# Patient Record
Sex: Male | Born: 1990 | Race: White | Hispanic: No | Marital: Single | Smoking: Current every day smoker
Health system: Southern US, Community
[De-identification: ages and names within clinical notes are randomized; demographics above are authoritative.]

## PROBLEM LIST (undated history)

## (undated) DIAGNOSIS — I253 Aneurysm of heart: Secondary | ICD-10-CM

## (undated) DIAGNOSIS — F1990 Other psychoactive substance use, unspecified, uncomplicated: Secondary | ICD-10-CM

## (undated) DIAGNOSIS — J939 Pneumothorax, unspecified: Secondary | ICD-10-CM

## (undated) DIAGNOSIS — K759 Inflammatory liver disease, unspecified: Secondary | ICD-10-CM

## (undated) DIAGNOSIS — K219 Gastro-esophageal reflux disease without esophagitis: Secondary | ICD-10-CM

## (undated) DIAGNOSIS — G43909 Migraine, unspecified, not intractable, without status migrainosus: Secondary | ICD-10-CM

## (undated) DIAGNOSIS — F1910 Other psychoactive substance abuse, uncomplicated: Secondary | ICD-10-CM

## (undated) DIAGNOSIS — R011 Cardiac murmur, unspecified: Secondary | ICD-10-CM

## (undated) HISTORY — PX: CHEST TUBE INSERTION: SHX231

## (undated) HISTORY — PX: WRIST SURGERY: SHX841

## (undated) HISTORY — DX: Migraine, unspecified, not intractable, without status migrainosus: G43.909

## (undated) HISTORY — DX: Gastro-esophageal reflux disease without esophagitis: K21.9

## (undated) HISTORY — DX: Inflammatory liver disease, unspecified: K75.9

## (undated) HISTORY — PX: CARDIAC SURGERY: SHX584

---

## 2008-11-15 ENCOUNTER — Emergency Department (HOSPITAL_COMMUNITY): Admission: EM | Admit: 2008-11-15 | Discharge: 2008-11-15 | Payer: Self-pay | Admitting: Emergency Medicine
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
## 2010-03-16 ENCOUNTER — Emergency Department (HOSPITAL_COMMUNITY)
Admission: EM | Admit: 2010-03-16 | Discharge: 2010-03-16 | Payer: Self-pay | Source: Home / Self Care | Admitting: Emergency Medicine

## 2010-08-25 ENCOUNTER — Emergency Department (HOSPITAL_COMMUNITY)
Admission: EM | Admit: 2010-08-25 | Discharge: 2010-08-25 | Payer: Self-pay | Source: Home / Self Care | Admitting: Emergency Medicine

## 2010-11-14 LAB — RAPID URINE DRUG SCREEN, HOSP PERFORMED
Barbiturates: NOT DETECTED
Benzodiazepines: POSITIVE — AB
Opiates: POSITIVE — AB

## 2010-11-14 LAB — ETHANOL: Alcohol, Ethyl (B): <5 mg/dL (ref 0–10)

## 2010-11-20 LAB — POCT I-STAT, CHEM 8
Calcium, Ion: 1.12 mmol/L (ref 1.12–1.32)
Chloride: 106 mEq/L (ref 96–112)
Glucose, Bld: 97 mg/dL (ref 70–99)
Hemoglobin: 15 g/dL (ref 13.0–17.0)
Sodium: 140 mEq/L (ref 135–145)

## 2010-11-20 LAB — URINALYSIS, ROUTINE W REFLEX MICROSCOPIC
Ketones, ur: NEGATIVE mg/dL
Nitrite: NEGATIVE
Urobilinogen, UA: 0.2 mg/dL (ref 0.0–1.0)
pH: 7.5 (ref 5.0–8.0)

## 2010-11-20 LAB — SAMPLE TO BLOOD BANK

## 2010-12-15 LAB — RAPID URINE DRUG SCREEN, HOSP PERFORMED
Amphetamines: NOT DETECTED
Benzodiazepines: POSITIVE — AB
Opiates: NOT DETECTED
Tetrahydrocannabinol: POSITIVE — AB

## 2010-12-15 LAB — CBC
HCT: 50.1 % (ref 39.0–52.0)
MCHC: 35 g/dL (ref 30.0–36.0)
Platelets: 230 10*3/uL (ref 150–400)
RBC: 5.24 MIL/uL (ref 4.22–5.81)
WBC: 15.2 10*3/uL — ABNORMAL HIGH (ref 4.0–10.5)

## 2010-12-15 LAB — COMPREHENSIVE METABOLIC PANEL
Albumin: 4.6 g/dL (ref 3.5–5.2)
BUN: 8 mg/dL (ref 6–23)
CO2: 21 mEq/L (ref 19–32)
Calcium: 9.1 mg/dL (ref 8.4–10.5)
GFR calc non Af Amer: >60 mL/min (ref >60)
Potassium: 3.8 mEq/L (ref 3.5–5.1)
Sodium: 142 mEq/L (ref 135–145)

## 2010-12-15 LAB — URINALYSIS, ROUTINE W REFLEX MICROSCOPIC
Leukocytes, UA: NEGATIVE
Specific Gravity, Urine: 1.005 (ref 1.005–1.030)
pH: 6 (ref 5.0–8.0)

## 2010-12-15 LAB — DIFFERENTIAL
Basophils Absolute: 0.1 10*3/uL (ref 0.0–0.1)
Basophils Relative: 1 % (ref 0–1)
Eosinophils Absolute: 0.2 10*3/uL (ref 0.0–0.7)
Lymphocytes Relative: 27 % (ref 12–46)
Monocytes Absolute: 0.8 10*3/uL (ref 0.1–1.0)
Monocytes Relative: 5 % (ref 3–12)
Neutro Abs: 10 10*3/uL — ABNORMAL HIGH (ref 1.7–7.7)

## 2010-12-15 LAB — URINE MICROSCOPIC-ADD ON
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
## 2012-07-01 ENCOUNTER — Encounter (HOSPITAL_COMMUNITY): Payer: Self-pay

## 2012-07-01 ENCOUNTER — Emergency Department (HOSPITAL_COMMUNITY)
Admission: EM | Admit: 2012-07-01 | Discharge: 2012-07-01 | Disposition: A | Discharge status: Home / Self Care | Payer: No Typology Code available for payment source | Attending: Emergency Medicine | Admitting: Emergency Medicine

## 2012-07-01 ENCOUNTER — Emergency Department (HOSPITAL_COMMUNITY): Payer: No Typology Code available for payment source

## 2012-07-01 DIAGNOSIS — R071 Chest pain on breathing: Secondary | ICD-10-CM | POA: Insufficient documentation

## 2012-07-01 DIAGNOSIS — R011 Cardiac murmur, unspecified: Secondary | ICD-10-CM | POA: Insufficient documentation

## 2012-07-01 DIAGNOSIS — R0789 Other chest pain: Secondary | ICD-10-CM

## 2012-07-01 DIAGNOSIS — J40 Bronchitis, not specified as acute or chronic: Secondary | ICD-10-CM | POA: Insufficient documentation

## 2012-07-01 DIAGNOSIS — F17200 Nicotine dependence, unspecified, uncomplicated: Secondary | ICD-10-CM | POA: Insufficient documentation

## 2012-07-01 HISTORY — DX: Cardiac murmur, unspecified: R01.1

## 2012-07-01 MED ORDER — ALBUTEROL SULFATE (5 MG/ML) 0.5% IN NEBU
2.5000 mg | INHALATION_SOLUTION | Freq: Once | RESPIRATORY_TRACT | Status: AC
  Administered 2012-07-01: 2.5 mg via RESPIRATORY_TRACT
  Filled 2012-07-01: qty 0.5

## 2012-07-01 MED ORDER — ACETAMINOPHEN 325 MG PO TABS
650.0000 mg | ORAL_TABLET | Freq: Once | ORAL | Status: AC
  Administered 2012-07-01: 650 mg via ORAL
  Filled 2012-07-01: qty 2

## 2012-07-01 MED ORDER — ALBUTEROL SULFATE HFA 108 (90 BASE) MCG/ACT IN AERS
2.0000 | INHALATION_SPRAY | RESPIRATORY_TRACT | Status: DC | PRN
  Administered 2012-07-01: 2 via RESPIRATORY_TRACT
  Filled 2012-07-01: qty 6.7

## 2012-07-01 MED ORDER — IPRATROPIUM BROMIDE 0.02 % IN SOLN
0.5000 mg | Freq: Once | RESPIRATORY_TRACT | Status: AC
  Administered 2012-07-01: 0.5 mg via RESPIRATORY_TRACT
  Filled 2012-07-01: qty 2.5

## 2012-07-01 MED ORDER — HYDROCODONE-HOMATROPINE 5-1.5 MG PO TABS: 1.0000 | ORAL_TABLET | Freq: Four times a day (QID) | ORAL | Status: DC | PRN

## 2012-07-01 NOTE — ED Notes (Signed)
Respiratory to give instructions on inhaler, patient has never used inhaler before

## 2012-07-01 NOTE — ED Notes (Signed)
Family at bedside. Patient was wondering how long till he got his breathing treatment. Patient was informed that Respiratory had been called and would be in shortly. RN made aware. Patient does not need anything else at this time.

## 2012-07-01 NOTE — ED Notes (Signed)
Pt c/o productive cough with "thick yellow" sputum x 3 days.  C/o soreness in ribs and chest, worse with coughing.  Reports generalized weakness, vomiting, and cold sweats at night.

## 2012-07-01 NOTE — ED Provider Notes (Signed)
History     CSN: [id number]  Arrival date & time 07/01/12  1423   First MD Initiated Contact with Patient 07/01/12 1533      Chief Complaint  Patient presents with  . Cough    (Consider location/radiation/quality/duration/timing/severity/associated sxs/prior treatment) Patient is a 21 y.o. male presenting with cough. The history is provided by the patient.  Cough  He has been complaining of a cough and sore throat for the last 3 days. He has had subjective fever as well as chills and sweats. Cough is productive of yellowish to greenish sputum. There is mild dyspnea. He has not taken any medication. He he is complaining of soreness in his chest with coughing. There's been no nausea or vomiting no arthralgias or myalgias. He denies any sick contacts. Where he works, he is exposed to a lot of dust and he does smoke one pack of cigarettes a day.  Past Medical History  Diagnosis Date  . Heart murmur     Past Surgical History  Procedure Date  . Wrist surgery     No family history on file.  History  Substance Use Topics  . Smoking status: Current Every Day Smoker  . Smokeless tobacco: Not on file  . Alcohol Use: Yes     occ      Review of Systems  Respiratory: Positive for cough.   All other systems reviewed and are negative.    Allergies  Neosporin and Sulfa antibiotics  Home Medications  No current outpatient prescriptions on file.  BP 121/72  Pulse 91  Temp 98.2 F (36.8 C) (Oral)  Resp 20  Ht 6' (1.829 m)  Wt 165 lb (74.844 kg)  BMI 22.38 kg/m2  SpO2 98%  Physical Exam  Nursing note and vitals reviewed. 21 year old male, resting comfortably and in no acute distress. Vital signs are normal. Oxygen saturation is 98%, which is normal. Head is normocephalic and atraumatic. PERRLA, EOMI. Oropharynx is clear. Neck is nontender and supple without adenopathy or JVD. Back is nontender and there is no CVA tenderness. Lungs have diffuse expiratory wheezes  without rales or rhonchi. Chest is nontender. Heart has regular rate and rhythm without murmur. Abdomen is soft, flat, nontender without masses or hepatosplenomegaly and peristalsis is normoactive. Extremities have no cyanosis or edema, full range of motion is present. Skin is warm and dry without rash. Neurologic: Mental status is normal, cranial nerves are intact, there are no motor or sensory deficits.   ED Course  Procedures (including critical care time)  Dg Chest 2 View  07/01/2012  *RADIOLOGY REPORT*  Clinical Data: Cough with chills and fever.  CHEST - 2 VIEW  Comparison: CXR 11/15/2008.  Findings:  The heart size and mediastinal contours are within normal limits.  Both lungs are clear.  The visualized skeletal structures are unremarkable.  IMPRESSION: No active cardiopulmonary disease.   Original Report Authenticated By: Elsie Stain, M.D.      1. Bronchitis   2. Chest wall pain       MDM  Respiratory tract infection with bronchospasm. This is most likely viral. Chest x-ray does not show any evidence of pneumonia. He is concerned about influenza, but he is outside of the timeframe during which antiviral medication would be effective. He'll be given a therapeutic trial of albuterol with Atrovent.  After albuterol with Atrovent, he is breathing better and on reexam, lungs are clear. He states he is still coughing and still having some soreness in his ribs.  He is advised that the soreness in her ribs would continue until he stopped coughing. There is no indication for antibiotics at this point. He is sent home with prescription for Hycodan tablets and he is given an albuterol inhaler.     Dione Booze, MD 07/01/12 1710

## 2013-01-20 ENCOUNTER — Emergency Department (HOSPITAL_COMMUNITY)
Admission: EM | Admit: 2013-01-20 | Discharge: 2013-01-20 | Disposition: A | Discharge status: Home / Self Care | Payer: Self-pay | Attending: Emergency Medicine | Admitting: Emergency Medicine

## 2013-01-20 ENCOUNTER — Emergency Department (HOSPITAL_COMMUNITY): Payer: Self-pay

## 2013-01-20 ENCOUNTER — Encounter (HOSPITAL_COMMUNITY): Payer: Self-pay

## 2013-01-20 DIAGNOSIS — IMO0002 Reserved for concepts with insufficient information to code with codable children: Secondary | ICD-10-CM | POA: Insufficient documentation

## 2013-01-20 DIAGNOSIS — S1093XA Contusion of unspecified part of neck, initial encounter: Secondary | ICD-10-CM | POA: Insufficient documentation

## 2013-01-20 DIAGNOSIS — S0003XA Contusion of scalp, initial encounter: Secondary | ICD-10-CM | POA: Insufficient documentation

## 2013-01-20 DIAGNOSIS — Z79899 Other long term (current) drug therapy: Secondary | ICD-10-CM | POA: Insufficient documentation

## 2013-01-20 DIAGNOSIS — R011 Cardiac murmur, unspecified: Secondary | ICD-10-CM | POA: Insufficient documentation

## 2013-01-20 DIAGNOSIS — F17200 Nicotine dependence, unspecified, uncomplicated: Secondary | ICD-10-CM | POA: Insufficient documentation

## 2013-01-20 LAB — ACETAMINOPHEN LEVEL: Acetaminophen (Tylenol), Serum: <15 ug/mL (ref 10–30)

## 2013-01-20 LAB — CBC WITH DIFFERENTIAL/PLATELET
Band Neutrophils: 0 % (ref 0–10)
Basophils Absolute: 0 10*3/uL (ref 0.0–0.1)
Basophils Relative: 0 % (ref 0–1)
Eosinophils Absolute: 0 10*3/uL (ref 0.0–0.7)
Eosinophils Relative: 0 % (ref 0–5)
HCT: 45.5 % (ref 39.0–52.0)
Hemoglobin: 16 g/dL (ref 13.0–17.0)
MCH: 32.9 pg (ref 26.0–34.0)
MCHC: 35.2 g/dL (ref 30.0–36.0)
MCV: 93.4 fL (ref 78.0–100.0)
Metamyelocytes Relative: 0 %
Myelocytes: 0 %
Neutro Abs: 3.7 10*3/uL (ref 1.7–7.7)
Neutrophils Relative %: 50 % (ref 43–77)
Promyelocytes Absolute: 0 %
RBC: 4.87 MIL/uL (ref 4.22–5.81)

## 2013-01-20 LAB — URINALYSIS, ROUTINE W REFLEX MICROSCOPIC
Hgb urine dipstick: NEGATIVE
Leukocytes, UA: NEGATIVE
Protein, ur: NEGATIVE mg/dL
Specific Gravity, Urine: >1.03 — ABNORMAL HIGH (ref 1.005–1.030)
Urobilinogen, UA: 0.2 mg/dL (ref 0.0–1.0)

## 2013-01-20 LAB — RAPID URINE DRUG SCREEN, HOSP PERFORMED
Cocaine: NOT DETECTED
Opiates: NOT DETECTED
Tetrahydrocannabinol: POSITIVE — AB

## 2013-01-20 LAB — BASIC METABOLIC PANEL
CO2: 23 mEq/L (ref 19–32)
Chloride: 105 mEq/L (ref 96–112)
Creatinine, Ser: 0.85 mg/dL (ref 0.50–1.35)
GFR calc Af Amer: >90 mL/min (ref >90)
Potassium: 3.9 mEq/L (ref 3.5–5.1)
Sodium: 142 mEq/L (ref 135–145)

## 2013-01-20 LAB — ETHANOL: Alcohol, Ethyl (B): 48 mg/dL — ABNORMAL HIGH (ref 0–11)
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
## 2013-01-20 NOTE — ED Notes (Signed)
Pt clothing bagged, labeled & placed in storage closet.
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
## 2013-01-20 NOTE — ED Notes (Signed)
Pt given his ER paperwork & pt left department w/ [employer].

## 2014-02-13 ENCOUNTER — Encounter (HOSPITAL_COMMUNITY): Payer: Self-pay | Admitting: Emergency Medicine

## 2014-02-13 ENCOUNTER — Emergency Department (HOSPITAL_COMMUNITY)
Admission: EM | Admit: 2014-02-13 | Discharge: 2014-02-13 | Disposition: A | Discharge status: Home / Self Care | Payer: Self-pay | Attending: Emergency Medicine | Admitting: Emergency Medicine

## 2014-02-13 DIAGNOSIS — L0390 Cellulitis, unspecified: Secondary | ICD-10-CM

## 2014-02-13 DIAGNOSIS — Z792 Long term (current) use of antibiotics: Secondary | ICD-10-CM | POA: Insufficient documentation

## 2014-02-13 DIAGNOSIS — I808 Phlebitis and thrombophlebitis of other sites: Secondary | ICD-10-CM | POA: Insufficient documentation

## 2014-02-13 DIAGNOSIS — R011 Cardiac murmur, unspecified: Secondary | ICD-10-CM | POA: Insufficient documentation

## 2014-02-13 DIAGNOSIS — I809 Phlebitis and thrombophlebitis of unspecified site: Secondary | ICD-10-CM

## 2014-02-13 DIAGNOSIS — F17200 Nicotine dependence, unspecified, uncomplicated: Secondary | ICD-10-CM | POA: Insufficient documentation

## 2014-02-13 DIAGNOSIS — IMO0002 Reserved for concepts with insufficient information to code with codable children: Secondary | ICD-10-CM | POA: Insufficient documentation

## 2014-02-13 MED ORDER — CEPHALEXIN 500 MG PO CAPS: 500.0000 mg | ORAL_CAPSULE | Freq: Four times a day (QID) | ORAL | Status: DC

## 2014-02-13 MED ORDER — IBUPROFEN 600 MG PO TABS: 600.0000 mg | ORAL_TABLET | Freq: Four times a day (QID) | ORAL | Status: DC | PRN

## 2014-02-13 MED ORDER — HYDROCODONE-ACETAMINOPHEN 5-325 MG PO TABS: 1.0000 | ORAL_TABLET | Freq: Four times a day (QID) | ORAL | Status: DC | PRN

## 2014-02-13 NOTE — Discharge Instructions (Signed)
Cellulitis  Cellulitis is an infection of the skin and the tissue beneath it. The infected area is usually red and tender. Cellulitis occurs most often in the arms and lower legs.   CAUSES   Cellulitis is caused by bacteria that enter the skin through cracks or cuts in the skin. The most common types of bacteria that cause cellulitis are Staphylococcus and Streptococcus.  SYMPTOMS    Redness and warmth.   Swelling.   Tenderness or pain.   Fever.  DIAGNOSIS   Your caregiver can usually determine what is wrong based on a physical exam. Blood tests may also be done.  TREATMENT   Treatment usually involves taking an antibiotic medicine.  HOME CARE INSTRUCTIONS    Take your antibiotics as directed. Finish them even if you start to feel better.   Keep the infected arm or leg elevated to reduce swelling.   Apply a warm cloth to the affected area up to 4 times per day to relieve pain.   Only take over-the-counter or prescription medicines for pain, discomfort, or fever as directed by your caregiver.   Keep all follow-up appointments as directed by your caregiver.  SEEK MEDICAL CARE IF:    You notice red streaks coming from the infected area.   Your red area gets larger or turns dark in color.   Your bone or joint underneath the infected area becomes painful after the skin has healed.   Your infection returns in the same area or another area.   You notice a swollen bump in the infected area.   You develop new symptoms.  SEEK IMMEDIATE MEDICAL CARE IF:    You have a fever.   You feel very sleepy.   You develop vomiting or diarrhea.   You have a general ill feeling (malaise) with muscle aches and pains.  MAKE SURE YOU:    Understand these instructions.   Will watch your condition.   Will get help right away if you are not doing well or get worse.  Document Released: 05/31/2005 Document Revised: 02/20/2012 Document Reviewed: 11/06/2011  Exit[employer] Patient Information 2014 [employer].  Phlebitis  Phlebitis is soreness and swelling (inflammation) of a vein. This can occur in your arms, legs, or torso (trunk), as well as deeper inside your body. Phlebitis is usually not serious when it occurs close to the surface of the body. However, it can cause serious problems when it occurs in a vein deeper inside the body.  CAUSES   Phlebitis can be triggered by various things, including:    Reduced blood flow through your veins. This can happen with:   Bed rest over a long period.   Long-distance travel.   Injury.   Surgery.   Being overweight (obese) or pregnant.   Having an IV tube put in the vein and getting certain medicines through the vein.   Cancer and cancer treatment.   Use of illegal drugs taken through the vein.   Inflammatory diseases.   Inherited (genetic) diseases that increase the risk of blood clots.   Hormone therapy, such as birth control pills.  SIGNS AND SYMPTOMS    Red, tender, swollen, and painful area on your skin. Usually, the area will be long and narrow.   Firmness along the center of the affected area. This can indicate that a blood clot has formed.   Low-grade fever.  DIAGNOSIS   A health care provider can usually diagnose phlebitis by examining the affected area and asking about   your symptoms. To check for infection or blood clots, your health care provider may order blood tests or an ultrasound exam of the area. Blood tests and your family history may also indicate if you have an underlying genetic disease that causes blood clots. Occasionally, a piece of tissue is taken from the body (biopsy sample) if an unusual cause of phlebitis is suspected.  TREATMENT   Treatment will vary depending on the severity of the condition and the area of the body affected. Treatment may include:   Use of a warm compress or heating pad.   Use of compression stockings or bandages.   Anti-inflammatory medicines.   Removal of any IV tube that may be causing the  problem.   Medicines that kill germs (antibiotics) if an infection is present.   Blood-thinning medicines if a blood clot is suspected or present.   In rare cases, surgery may be needed to remove damaged sections of vein.  HOME CARE INSTRUCTIONS    Only take over-the-counter or prescription medicines as directed by your health care provider. Take all medicines exactly as prescribed.   Raise (elevate) the affected area above the level of your heart as directed by your health care provider.   Apply a warm compress or heating pad to the affected area as directed by your health care provider. Do not sleep with the heating pad.   Use compression stockings or bandages as directed. These will speed healing and prevent the condition from coming back.   If you are on blood thinners:   Get follow-up blood tests as directed by your health care provider.   Check with your health care provider before using any new medicines.   Carry a medical alert card or wear your medical alert jewelry to show that you are on blood thinners.   For phlebitis in the legs:   Avoid prolonged standing or bed rest.   Keep your legs moving. Raise your legs when sitting or lying.   Do not smoke.   Women, particularly those over the age of 35, should consider the risks and benefits of taking the contraceptive pill. This kind of hormone treatment can increase your risk for blood clots.   Follow up with your health care provider as directed.  SEEK MEDICAL CARE IF:    You have unusual bruising or any bleeding problems.   Your swelling or pain in the affected area is not improving.   You are on anti-inflammatory medicine, and you develop belly (abdominal) pain.  SEEK IMMEDIATE MEDICAL CARE IF:    You have a sudden onset of chest pain or difficulty breathing.   You have a fever or persistent symptoms for more than 2 3 days.   You have a fever and your symptoms suddenly get worse.  MAKE SURE YOU:   Understand these  instructions.   Will watch your condition.   Will get help right away if you are not doing well or get worse.  Document Released: 08/15/2001 Document Revised: 06/11/2013 Document Reviewed: 04/28/2013  [employer] Patient Information 2014 [employer].

## 2014-02-13 NOTE — Care Management Note (Signed)
ED/CM noted patient did not have health insurance and/or PCP listed in the computer.  Patient was given the [place] resource handout with information on the clinics, food pantries, and the handout for new health insurance sign-up.  Patient expressed appreciation for information received. 

## 2014-02-13 NOTE — ED Notes (Signed)
MD at bedside with ultrasound

## 2014-02-13 NOTE — ED Provider Notes (Signed)
CSN: [id number]     Arrival date & time 02/13/14  1204 History  This chart was scribed for Shon Batonourtney F Herberth Deharo, MD by Shari HeritageAisha Amuda, ED Scribe. The patient was seen in room APA12/APA12. Patient's care was started at 12:16 PM.  Chief Complaint  Patient presents with  . Abscess    The history is provided by the patient. No language interpreter was used.    HPI Comments: Peter Wiley is a 23 y.o. male who presents to the Emergency Department complaining of painful, swollen, red area to the left upper arm onset 2 weeks ago. He states that "I stuck a pin in it but didn't get anything out." He states that the area has been getting progressively more painful, red and swollen. He reports associated warmth to the area. Patient also states that he has felt warm, but has not taken his temperature. He says that his relative gave him 1 tablet of Vicodin which he has taken without pain relief. He also took 3 tablets of amoxicillin (which he got from a different relative) over the past day without relief. He denies nausea, vomiting or chills. He denies IV drug use. He drinks alcohol occasionally. He does not take any medications on a regular basis.   Past Medical History  Diagnosis Date  . Heart murmur    Past Surgical History  Procedure Laterality Date  . Wrist surgery     History reviewed. No pertinent family history. History  Substance Use Topics  . Smoking status: Current Every Day Smoker  . Smokeless tobacco: Not on file  . Alcohol Use: Yes     Comment: occ    Review of Systems  Constitutional: Negative.  Negative for fever.  Respiratory: Negative.  Negative for chest tightness and shortness of breath.   Cardiovascular: Negative.  Negative for chest pain.  Gastrointestinal: Negative.   Genitourinary: Negative.   Musculoskeletal: Negative for back pain.  Skin: Positive for color change.       Warmth and errythema to anticubital on left  Neurological: Negative for headaches.  All other  systems reviewed and are negative.     Allergies  Neosporin and Sulfa antibiotics  Home Medications   Prior to Admission medications   Medication Sig Start Date End Date Taking? Authorizing Provider  cephALEXin (KEFLEX) 500 MG capsule Take 1 capsule (500 mg total) by mouth 4 (four) times daily. 02/13/14   Shon Batonourtney F Dagen Beevers, MD  HYDROcodone-acetaminophen (NORCO/VICODIN) 5-325 MG per tablet Take 1 tablet by mouth every 6 (six) hours as needed for moderate pain or severe pain. 02/13/14   Shon Batonourtney F Khamani Daniely, MD  ibuprofen (ADVIL,MOTRIN) 600 MG tablet Take 1 tablet (600 mg total) by mouth every 6 (six) hours as needed. 02/13/14   Shon Batonourtney F Shariece Viveiros, MD   Triage Vitals: BP 116/66  Pulse 92  Temp(Src) 98.3 F (36.8 C) (Oral)  Resp 18  Ht 6' (1.829 m)  Wt 160 lb (72.576 kg)  BMI 21.70 kg/m2  SpO2 98% Physical Exam  Nursing note and vitals reviewed. Constitutional: He is oriented to person, place, and time. He appears well-developed and well-nourished.  Multiple tatoos  HENT:  Head: Normocephalic and atraumatic.  Cardiovascular: Normal rate and regular rhythm.   No murmur heard. Pulmonary/Chest: Effort normal. No respiratory distress.  Musculoskeletal: He exhibits no edema.  Tenderness to palpation of the left antecubital fossa extending proximally, mild erythema, track mark noted, no obvious fluctuance  Lymphadenopathy:    He has no cervical adenopathy.  Neurological: He is  alert and oriented to person, place, and time.  Skin: Skin is warm and dry.  Warmth and erythema to the left antecubital fossa as noted above  Psychiatric: He has a normal mood and affect.    ED Cou.rse  Procedures (including critical care time) DIAGNOSTIC STUDIES: Oxygen Saturation is 98% on room air, normal by my interpretation.    COORDINATION OF CARE: 12:25 PM- Patient informed of current plan for treatment and evaluation and agrees with plan at this time.   EMERGENCY DEPARTMENT US SOFT TISSUE  INTERPRETATION "Study: Limited Ultrasound of the noted body part in comments below"  INDICATIONS: Soft tissue infection Multiple views of the body part are obtained with a multi-frequency linear probe  PERFORMED BY:  Myself  IMAGES ARCHIVED?: No  SIDE:Left  BODY PART:Upper extremity  FINDINGS: Other Evidence of cellulitis, there is edema surrounding the vein but no obvious abscess  LIMITATIONS:  Emergent Procedure  INTERPRETATION:  Cellulitis present  COMMENT:  Cellulitis present. Evidence of phlebitis with edema surrounding vasculature. No obvious abscess   Labs Review Labs Reviewed - No data to display  Imaging Review No results found.   EKG Interpretation None      MDM   Final diagnoses:  Phlebitis  Cellulitis    Patient presents with warmth and erythema to the left upper extremity. Patient also noted to have pain. No fevers. Patient denies IV drug use but track mark noted on physical exam. Bedside ultrasound shows no evidence of abscess but does show cellulitis as well as evidence of phlebitis. Discuss with patient treatment including anti-inflammatories, pain medication, and compresses. Given erythema associated with the site and the patient having punctured the site with a sterile needle, will also cover with antibiotics. Patient was given strict return precautions including fevers, increasing pain, increasing redness to the site.  After history, exam, and medical workup I feel the patient has been appropriately medically screened and is safe for discharge home. Pertinent diagnoses were discussed with the patient. Patient was given return precautions.  I personally performed the services described in this documentation, which was scribed in my presence. The recorded information has been reviewed and is accurate.   Shon Batonourtney F Christobal Morado, MD 02/13/14 1250

## 2014-02-13 NOTE — ED Notes (Signed)
Pt has had swelling to lt  Antecubital area for 2 weeks, "struck a pin in it " 1 week ago, Increasing pain  And redness

## 2014-04-24 ENCOUNTER — Encounter (HOSPITAL_COMMUNITY): Payer: Self-pay | Admitting: Emergency Medicine

## 2014-04-24 ENCOUNTER — Emergency Department (HOSPITAL_COMMUNITY)
Admission: EM | Admit: 2014-04-24 | Discharge: 2014-04-24 | Disposition: A | Discharge status: Home / Self Care | Payer: Self-pay | Attending: Emergency Medicine | Admitting: Emergency Medicine

## 2014-04-24 DIAGNOSIS — L539 Erythematous condition, unspecified: Secondary | ICD-10-CM | POA: Insufficient documentation

## 2014-04-24 DIAGNOSIS — L0201 Cutaneous abscess of face: Secondary | ICD-10-CM | POA: Insufficient documentation

## 2014-04-24 DIAGNOSIS — L03211 Cellulitis of face: Secondary | ICD-10-CM

## 2014-04-24 DIAGNOSIS — R112 Nausea with vomiting, unspecified: Secondary | ICD-10-CM | POA: Insufficient documentation

## 2014-04-24 DIAGNOSIS — F17200 Nicotine dependence, unspecified, uncomplicated: Secondary | ICD-10-CM | POA: Insufficient documentation

## 2014-04-24 DIAGNOSIS — R011 Cardiac murmur, unspecified: Secondary | ICD-10-CM | POA: Insufficient documentation

## 2014-04-24 MED ORDER — PROMETHAZINE HCL 25 MG PO TABS: 25.0000 mg | ORAL_TABLET | Freq: Four times a day (QID) | ORAL | Status: DC | PRN

## 2014-04-24 MED ORDER — DOXYCYCLINE HYCLATE 100 MG PO CAPS: 100.0000 mg | ORAL_CAPSULE | Freq: Two times a day (BID) | ORAL | Status: DC

## 2014-04-24 NOTE — ED Provider Notes (Signed)
CSN: [id number]     Arrival date & time 04/24/14  [id number] History   First MD Initiated Contact with Patient 04/24/14 0902     Chief Complaint  Patient presents with  . Abscess     (Consider location/radiation/quality/duration/timing/severity/associated sxs/prior Treatment) HPI Comments: Patient is an otherwise healthy 23 year old male who presents to the emergency department with 3 days of gradually worsening erythema to his chin. He reports that this began as a pimple. He has been squeezing his pimple. He was able to get purulent drainage out yesterday. When he woke up this morning he had worsening redness to that area. He describes the pain as a soreness. He reports that last night he had some associated vomiting. No fevers, chills, trismus. He reports that he does not get abscesses or cellulitis frequently. He currently does not have any nausea or vomiting.   The history is provided by the patient. No language interpreter was used.    Past Medical History  Diagnosis Date  . Heart murmur    Past Surgical History  Procedure Laterality Date  . Wrist surgery     No family history on file. History  Substance Use Topics  . Smoking status: Current Every Day Smoker    Types: Cigarettes  . Smokeless tobacco: Not on file  . Alcohol Use: Yes     Comment: occ    Review of Systems  Constitutional: Negative for fever and chills.  Respiratory: Negative for shortness of breath.   Cardiovascular: Negative for chest pain.  Gastrointestinal: Positive for nausea and vomiting. Negative for abdominal pain.  Skin: Positive for color change.  All other systems reviewed and are negative.     Allergies  Keflex; Neosporin; and Sulfa antibiotics  Home Medications   Prior to Admission medications   Not on File   BP 136/85  Pulse 76  Temp(Src) 98.1 F (36.7 C) (Oral)  Resp 16  Ht 6' (1.829 m)  Wt 155 lb (70.308 kg)  BMI 21.02 kg/m2  SpO2 100% Physical Exam  Nursing note and vitals  reviewed. Constitutional: He is oriented to person, place, and time. He appears well-developed and well-nourished. No distress.  HENT:  Head: Normocephalic and atraumatic.  Right Ear: External ear normal.  Left Ear: External ear normal.  Nose: Nose normal.  3 cm of erythema to chin. Area is indurated without any fluctuance. There is a central scab. No trismus, submental edema, tongue elevation  Eyes: Conjunctivae are normal.  Neck: Normal range of motion. No tracheal deviation present.  Cardiovascular: Normal rate, regular rhythm and normal heart sounds.   Pulmonary/Chest: Effort normal and breath sounds normal. No stridor.  Abdominal: Soft. He exhibits no distension. There is no tenderness.  Musculoskeletal: Normal range of motion.  Neurological: He is alert and oriented to person, place, and time.  Skin: Skin is warm and dry. He is not diaphoretic.  Psychiatric: He has a normal mood and affect. His behavior is normal.    ED Course  Procedures (including critical care time) Labs Review Labs Reviewed - No data to display  Imaging Review No results found.   EKG Interpretation None      MDM   Final diagnoses:  Cellulitis of face   Patient presents with 3 cm of erythema and induration to chin. At this point there is no abscess to drain. Encouraged warm compresses. He was given doxycycline rx. Urged to take Tylenol and ibuprofen around the clock for pain. He was encouraged to followup with his primary  care physician. Discussed reasons to return to emergency department. Vital signs stable for discharge. Patient / Family / Caregiver informed of clinical course, understand medical decision-making process, and agree with plan.    Mora Bellman, PA-C 04/24/14 [phone]

## 2014-04-24 NOTE — ED Notes (Signed)
Abscess to chin x 3-4 days.  Reports draining yesterday.

## 2014-04-24 NOTE — ED Provider Notes (Signed)
Medical screening examination/treatment/procedure(s) were performed by non-physician practitioner and as supervising physician I was immediately available for consultation/collaboration.   EKG Interpretation None        Yamilee Harmes L Toshika Parrow, MD 04/24/14 1129 

## 2014-04-24 NOTE — Discharge Instructions (Signed)

## 2014-07-09 ENCOUNTER — Telehealth: Payer: Self-pay | Admitting: Family Medicine

## 2014-07-10 NOTE — Telephone Encounter (Signed)
Pt given new pt appt with dr Hyacinth Meekermiller 12/17, pt is having anxiety attacks and has had them in the past but more frequent now since his wife packed up and left him and took the kids.

## 2014-08-20 ENCOUNTER — Ambulatory Visit: Payer: Self-pay | Admitting: Family Medicine

## 2015-02-15 ENCOUNTER — Encounter (HOSPITAL_COMMUNITY): Payer: Self-pay

## 2015-02-15 ENCOUNTER — Emergency Department (HOSPITAL_COMMUNITY)
Admission: EM | Admit: 2015-02-15 | Discharge: 2015-02-15 | Disposition: A | Discharge status: Home / Self Care | Payer: Self-pay | Attending: Emergency Medicine | Admitting: Emergency Medicine

## 2015-02-15 DIAGNOSIS — L02413 Cutaneous abscess of right upper limb: Secondary | ICD-10-CM | POA: Insufficient documentation

## 2015-02-15 DIAGNOSIS — Z720 Tobacco use: Secondary | ICD-10-CM | POA: Insufficient documentation

## 2015-02-15 DIAGNOSIS — L0291 Cutaneous abscess, unspecified: Secondary | ICD-10-CM

## 2015-02-15 DIAGNOSIS — R011 Cardiac murmur, unspecified: Secondary | ICD-10-CM | POA: Insufficient documentation

## 2015-02-15 DIAGNOSIS — L0390 Cellulitis, unspecified: Secondary | ICD-10-CM

## 2015-02-15 DIAGNOSIS — Z792 Long term (current) use of antibiotics: Secondary | ICD-10-CM | POA: Insufficient documentation

## 2015-02-15 MED ORDER — LIDOCAINE-EPINEPHRINE (PF) 1 %-1:200000 IJ SOLN
INTRAMUSCULAR | Status: AC
  Filled 2015-02-15: qty 10

## 2015-02-15 MED ORDER — LIDOCAINE-EPINEPHRINE (PF) 1 %-1:200000 IJ SOLN
20.0000 mL | Freq: Once | INTRAMUSCULAR | Status: AC
  Administered 2015-02-15: 05:00:00

## 2015-02-15 MED ORDER — SULFAMETHOXAZOLE-TRIMETHOPRIM 800-160 MG PO TABS: 1.0000 | ORAL_TABLET | Freq: Two times a day (BID) | ORAL | Status: AC

## 2015-02-15 MED ORDER — CEPHALEXIN 500 MG PO CAPS: 500.0000 mg | ORAL_CAPSULE | Freq: Four times a day (QID) | ORAL | Status: DC

## 2015-02-15 MED ORDER — ONDANSETRON 4 MG PO TBDP: 4.0000 mg | ORAL_TABLET | Freq: Three times a day (TID) | ORAL | Status: DC | PRN

## 2015-02-15 NOTE — Discharge Instructions (Signed)
You were seen today for abscesses cellulitis. This is likely secondary to IV drug use. He does take anti-biotics as directed. Allow wicking to fall out on its own. If you have fever, increased redness, swelling site need to be reevaluated immediately.  Abscess An abscess is an infected area that contains a collection of pus and debris.It can occur in almost any part of the body. An abscess is also known as a furuncle or boil. CAUSES  An abscess occurs when tissue gets infected. This can occur from blockage of oil or sweat glands, infection of hair follicles, or a minor injury to the skin. As the body tries to fight the infection, pus collects in the area and creates pressure under the skin. This pressure causes pain. People with weakened immune systems have difficulty fighting infections and get certain abscesses more often.  SYMPTOMS Usually an abscess develops on the skin and becomes a painful mass that is red, warm, and tender. If the abscess forms under the skin, you may feel a moveable soft area under the skin. Some abscesses break open (rupture) on their own, but most will continue to get worse without care. The infection can spread deeper into the body and eventually into the bloodstream, causing you to feel ill.  DIAGNOSIS  Your caregiver will take your medical history and perform a physical exam. A sample of fluid may also be taken from the abscess to determine what is causing your infection. TREATMENT  Your caregiver may prescribe antibiotic medicines to fight the infection. However, taking antibiotics alone usually does not cure an abscess. Your caregiver may need to make a small cut (incision) in the abscess to drain the pus. In some cases, gauze is packed into the abscess to reduce pain and to continue draining the area. HOME CARE INSTRUCTIONS   Only take over-the-counter or prescription medicines for pain, discomfort, or fever as directed by your caregiver.  If you were prescribed  antibiotics, take them as directed. Finish them even if you start to feel better.  If gauze is used, follow your caregiver's directions for changing the gauze.  To avoid spreading the infection:  Keep your draining abscess covered with a bandage.  Wash your hands well.  Do not share personal care items, towels, or whirlpools with others.  Avoid skin contact with others.  Keep your skin and clothes clean around the abscess.  Keep all follow-up appointments as directed by your caregiver. SEEK MEDICAL CARE IF:   You have increased pain, swelling, redness, fluid drainage, or bleeding.  You have muscle aches, chills, or a general ill feeling.  You have a fever. MAKE SURE YOU:   Understand these instructions.  Will watch your condition.  Will get help right away if you are not doing well or get worse. Document Released: 05/31/2005 Document Revised: 02/20/2012 Document Reviewed: 11/03/2011 [facility] Patient Information 2015 [place], [place]. This information is not intended to replace advice given to you by your health care provider. Make sure you discuss any questions you have with your health care provider.  Cellulitis Cellulitis is an infection of the skin and the tissue beneath it. The infected area is usually red and tender. Cellulitis occurs most often in the arms and lower legs.  CAUSES  Cellulitis is caused by bacteria that enter the skin through cracks or cuts in the skin. The most common types of bacteria that cause cellulitis are staphylococci and streptococci. SIGNS AND SYMPTOMS   Redness and warmth.  Swelling.  Tenderness or  pain.  Fever. DIAGNOSIS  Your health care provider can usually determine what is wrong based on a physical exam. Blood tests may also be done. TREATMENT  Treatment usually involves taking an antibiotic medicine. HOME CARE INSTRUCTIONS   Take your antibiotic medicine as directed by your health care provider. Finish the antibiotic even if  you start to feel better.  Keep the infected arm or leg elevated to reduce swelling.  Apply a warm cloth to the affected area up to 4 times per day to relieve pain.  Take medicines only as directed by your health care provider.  Keep all follow-up visits as directed by your health care provider. SEEK MEDICAL CARE IF:   You notice red streaks coming from the infected area.  Your red area gets larger or turns dark in color.  Your bone or joint underneath the infected area becomes painful after the skin has healed.  Your infection returns in the same area or another area.  You notice a swollen bump in the infected area.  You develop new symptoms.  You have a fever. SEEK IMMEDIATE MEDICAL CARE IF:   You feel very sleepy.  You develop vomiting or diarrhea.  You have a general ill feeling (malaise) with muscle aches and pains. MAKE SURE YOU:   Understand these instructions.  Will watch your condition.  Will get help right away if you are not doing well or get worse. Document Released: 05/31/2005 Document Revised: 01/05/2014 Document Reviewed: 11/06/2011 [facility] Patient Information 2015 [place], [place]. This information is not intended to replace advice given to you by your health care provider. Make sure you discuss any questions you have with your health care provider.

## 2015-02-15 NOTE — ED Notes (Signed)
Pt c/o abscess to right inner albow. Patient states he noticed it a week ago, denies trauma/injury.

## 2015-02-15 NOTE — ED Provider Notes (Signed)
CSN: [id number]     Arrival date & time 02/15/15  0406 History   First MD Initiated Contact with Patient 02/15/15 0410     Chief Complaint  Patient presents with  . Abscess     (Consider location/radiation/quality/duration/timing/severity/associated sxs/prior Treatment) HPI  This is a 24 year old male with a history of IV drug use who presents with an abscess of the right arm. Patient reports 5 day history of worsening pain and swelling to the right antecubital fossa. He reports increasing pain. Current pain 10 out of 10. He last shot up 3-4 days ago. Denies any systemic fevers. Has noted increased redness to the site. History of abscesses in the past. There are documented allergies to both Bactrim and Keflex; however, patient states that he has tolerated these in the past.  Past Medical History  Diagnosis Date  . Heart murmur    Past Surgical History  Procedure Laterality Date  . Wrist surgery     History reviewed. No pertinent family history. History  Substance Use Topics  . Smoking status: Current Every Day Smoker -- 1.00 packs/day    Types: Cigarettes  . Smokeless tobacco: Not on file  . Alcohol Use: Yes     Comment: occ    Review of Systems  Constitutional: Negative for fever.  Skin: Positive for color change.       Abscess  All other systems reviewed and are negative.     Allergies  Keflex; Neosporin; and Sulfa antibiotics  Home Medications   Prior to Admission medications   Medication Sig Start Date End Date Taking? Authorizing Provider  cephALEXin (KEFLEX) 500 MG capsule Take 1 capsule (500 mg total) by mouth 4 (four) times daily. 02/15/15   Shon Baton, MD  doxycycline (VIBRAMYCIN) 100 MG capsule Take 1 capsule (100 mg total) by mouth 2 (two) times daily. One po bid x 7 days 04/24/14   Junious Silk, PA-C  ondansetron (ZOFRAN ODT) 4 MG disintegrating tablet Take 1 tablet (4 mg total) by mouth every 8 (eight) hours as needed for nausea or vomiting.  02/15/15   Shon Baton, MD  promethazine (PHENERGAN) 25 MG tablet Take 1 tablet (25 mg total) by mouth every 6 (six) hours as needed for nausea or vomiting. 04/24/14   Junious Silk, PA-C  sulfamethoxazole-trimethoprim (BACTRIM DS,SEPTRA DS) 800-160 MG per tablet Take 1 tablet by mouth 2 (two) times daily. 02/15/15 02/22/15  Shon Baton, MD   BP 120/74 mmHg  Pulse 78  Temp(Src) 98.2 F (36.8 C) (Oral)  Resp 18  Ht 6' (1.829 m)  Wt 145 lb (65.772 kg)  BMI 19.66 kg/m2  SpO2 100% Physical Exam  Constitutional: He is oriented to person, place, and time. He appears well-developed and well-nourished.  No distress  HENT:  Head: Normocephalic and atraumatic.  Cardiovascular: Normal rate and regular rhythm.   No murmur heard. Pulmonary/Chest: Effort normal. No respiratory distress.  Neurological: He is alert and oriented to person, place, and time.  Skin: Skin is warm and dry.  Redness and erythema with fluctuant abscess approximately 2 x 2 cm over the medial aspect of the right antecubital fossa   Psychiatric: He has a normal mood and affect.  Nursing note and vitals reviewed.   ED Course  INCISION AND DRAINAGE Date/Time: 02/15/2015 6:52 AM Performed by: Shon Baton Authorized by: Shon Baton Consent: Verbal consent obtained. Risks and benefits: risks, benefits and alternatives were discussed Consent given by: patient Patient identity confirmed: verbally with patient  Type: abscess Body area: upper extremity Location details: right elbow Anesthesia: local infiltration Local anesthetic: lidocaine 1% with epinephrine Anesthetic total: 4 ml Patient sedated: no Scalpel size: 11 Incision type: single straight Complexity: simple Drainage: purulent Drainage amount: moderate Wound treatment: drain placed Packing material: 1/4 in iodoform gauze Patient tolerance: Patient tolerated the procedure well with no immediate complications   (including critical care  time)  Labs Review Labs Reviewed - No data to display  Imaging Review No results found.   EKG Interpretation None      MDM   Final diagnoses:  Abscess and cellulitis     Patient presents with cellulitis and abscess likely secondary to IV drug use.  Abscess drained without complication. Given adjacent cellulitis, will place on Bactrim and Keflex. Patient states that he cannot afford doxycycline. He states that he has tolerated these medications in the past.  Patient was encouraged to discontinue IV drug use as it has multiple potential complications.  After history, exam, and medical workup I feel the patient has been appropriately medically screened and is safe for discharge home. Pertinent diagnoses were discussed with the patient. Patient was given return precautions.     Shon Baton, MD 02/15/15 [id number]

## 2015-11-28 ENCOUNTER — Emergency Department (HOSPITAL_COMMUNITY)
Admission: EM | Admit: 2015-11-28 | Discharge: 2015-11-28 | Disposition: A | Discharge status: Home / Self Care | Payer: Self-pay | Attending: Emergency Medicine | Admitting: Emergency Medicine

## 2015-11-28 ENCOUNTER — Encounter (HOSPITAL_COMMUNITY): Payer: Self-pay | Admitting: Emergency Medicine

## 2015-11-28 DIAGNOSIS — F17210 Nicotine dependence, cigarettes, uncomplicated: Secondary | ICD-10-CM | POA: Insufficient documentation

## 2015-11-28 DIAGNOSIS — M7989 Other specified soft tissue disorders: Secondary | ICD-10-CM

## 2015-11-28 DIAGNOSIS — T7840XA Allergy, unspecified, initial encounter: Secondary | ICD-10-CM

## 2015-11-28 DIAGNOSIS — T7849XA Other allergy, initial encounter: Secondary | ICD-10-CM | POA: Insufficient documentation

## 2015-11-28 HISTORY — DX: Other psychoactive substance abuse, uncomplicated: F19.10

## 2015-11-28 MED ORDER — PREDNISONE 50 MG PO TABS
60.0000 mg | ORAL_TABLET | Freq: Once | ORAL | Status: AC
  Administered 2015-11-28: 60 mg via ORAL
  Filled 2015-11-28: qty 1

## 2015-11-28 MED ORDER — DIPHENHYDRAMINE HCL 25 MG PO CAPS
50.0000 mg | ORAL_CAPSULE | Freq: Once | ORAL | Status: AC
  Administered 2015-11-28: 50 mg via ORAL
  Filled 2015-11-28: qty 2

## 2015-11-28 MED ORDER — FAMOTIDINE 20 MG PO TABS
20.0000 mg | ORAL_TABLET | Freq: Once | ORAL | Status: AC
  Administered 2015-11-28: 20 mg via ORAL
  Filled 2015-11-28: qty 1

## 2015-11-28 NOTE — Discharge Instructions (Signed)

## 2015-11-28 NOTE — ED Notes (Signed)
Per EMS, pt from home. 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 Pt reports swelling, redness, and burning to RT arm.
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
## 2016-05-02 ENCOUNTER — Emergency Department (HOSPITAL_COMMUNITY)
Admission: EM | Admit: 2016-05-02 | Discharge: 2016-05-02 | Disposition: A | Discharge status: Home / Self Care | Payer: Self-pay | Attending: Emergency Medicine | Admitting: Emergency Medicine

## 2016-05-02 ENCOUNTER — Encounter (HOSPITAL_COMMUNITY): Payer: Self-pay

## 2016-05-02 ENCOUNTER — Emergency Department (HOSPITAL_COMMUNITY): Payer: Self-pay

## 2016-05-02 DIAGNOSIS — W06XXXA Fall from bed, initial encounter: Secondary | ICD-10-CM | POA: Insufficient documentation

## 2016-05-02 DIAGNOSIS — Y929 Unspecified place or not applicable: Secondary | ICD-10-CM | POA: Insufficient documentation

## 2016-05-02 DIAGNOSIS — M545 Low back pain, unspecified: Secondary | ICD-10-CM

## 2016-05-02 DIAGNOSIS — Z79891 Long term (current) use of opiate analgesic: Secondary | ICD-10-CM | POA: Insufficient documentation

## 2016-05-02 DIAGNOSIS — Y999 Unspecified external cause status: Secondary | ICD-10-CM | POA: Insufficient documentation

## 2016-05-02 DIAGNOSIS — F17210 Nicotine dependence, cigarettes, uncomplicated: Secondary | ICD-10-CM | POA: Insufficient documentation

## 2016-05-02 DIAGNOSIS — Y939 Activity, unspecified: Secondary | ICD-10-CM | POA: Insufficient documentation

## 2016-05-02 MED ORDER — KETOROLAC TROMETHAMINE 60 MG/2ML IM SOLN
60.0000 mg | Freq: Once | INTRAMUSCULAR | Status: AC
  Administered 2016-05-02: 60 mg via INTRAMUSCULAR
  Filled 2016-05-02: qty 2

## 2016-05-02 MED ORDER — METHOCARBAMOL 500 MG PO TABS: 500.0000 mg | ORAL_TABLET | Freq: Two times a day (BID) | ORAL | 0 refills | Status: DC

## 2016-05-02 MED ORDER — DICLOFENAC SODIUM 50 MG PO TBEC: 50.0000 mg | DELAYED_RELEASE_TABLET | Freq: Two times a day (BID) | ORAL | 0 refills | Status: DC

## 2016-05-02 NOTE — ED Provider Notes (Signed)
[facility]-EMERGENCY DEPT Provider Note   CSN: [id number] Arrival date & time: 05/02/16  7829     History   Chief Complaint Chief Complaint  Patient presents with  . Back Pain    HPI Peter Wiley is a 25 y.o. male.  The history is provided by the patient. No language interpreter was used.  Back Pain   This is a new problem. The problem occurs constantly. The problem has been gradually worsening. The pain is associated with falling. The pain is present in the lumbar spine. The pain does not radiate. The pain is at a severity of 5/10. The pain is moderate.  Pt reports he rolled out of bed Saturday after drinking.  Pt reports he has had pain since.   Past Medical History:  Diagnosis Date  . Drug abuse   . Heart murmur     There are no active problems to display for this patient.   Past Surgical History:  Procedure Laterality Date  . WRIST SURGERY         Home Medications    Prior to Admission medications   Medication Sig Start Date End Date Taking? Authorizing Provider  Buprenorphine HCl-Naloxone HCl (SUBOXONE) 8-2 MG FILM Place 1 Film under the tongue daily.   Yes Historical Provider, MD    Family History No family history on file.  Social History Social History  Substance Use Topics  . Smoking status: Current Every Day Smoker    Packs/day: 1.00    Types: Cigarettes  . Smokeless tobacco: Never Used  . Alcohol use Yes     Comment: occ     Allergies   Keflex [cephalexin]; Neosporin [neomycin-bacitracin zn-polymyx]; and Sulfa antibiotics   Review of Systems Review of Systems  Musculoskeletal: Positive for back pain.  All other systems reviewed and are negative.    Physical Exam Updated Vital Signs BP 142/83 (BP Location: Left Arm)   Pulse 81   Temp 98.3 F (36.8 C) (Oral)   Resp 18   Ht 5\' 11"  (1.803 m)   Wt 70.3 kg   SpO2 97%   BMI 21.62 kg/m   Physical Exam  Constitutional: He appears well-developed and well-nourished.  HENT:  Head:  Normocephalic and atraumatic.  Eyes: Conjunctivae are normal.  Neck: Neck supple.  Cardiovascular: Normal rate and regular rhythm.   No murmur heard. Pulmonary/Chest: Effort normal and breath sounds normal. No respiratory distress.  Abdominal: Soft. There is tenderness.  Musculoskeletal: He exhibits no edema.  Neurological: He is alert.  Skin: Skin is warm and dry.  Psychiatric: He has a normal mood and affect.  Nursing note and vitals reviewed.    ED Treatments / Results  Labs (all labs ordered are listed, but only abnormal results are displayed) Labs Reviewed - No data to display  EKG  EKG Interpretation None       Radiology Dg Lumbar Spine Complete  Result Date: 05/02/2016 CLINICAL DATA:  Fall out of bed 3 nights ago with low back pain. Initial encounter. EXAM: LUMBAR SPINE - COMPLETE 4+ VIEW COMPARISON:  05/19/2008 FINDINGS: No acute fracture or subluxation identified. No evidence of degenerative disc disease or significant proliferative changes. No bony lesions or destruction. Surrounding soft tissues are unremarkable. IMPRESSION: Normal lumbar spine radiographs. Electronically Signed   By: Irish Lack M.D.   On: 05/02/2016 10:41    Procedures Procedures (including critical care time)  Medications Ordered in ED Medications  ketorolac (TORADOL) injection 60 mg (60 mg Intramuscular Given 05/02/16 0949)  Initial Impression / Assessment and Plan / ED Course  I have reviewed the triage vital signs and the nursing notes.  Pertinent labs & imaging results that were available during my care of the patient were reviewed by me and considered in my medical decision making (see chart for details).  Clinical Course   Pt given torodol IM.   xrays normal.   I will treat with antiinflammatory and muscle relexer   Final Clinical Impressions(s) / ED Diagnoses   Final diagnoses:  Bilateral low back pain without sciatica    New Prescriptions New Prescriptions    DICLOFENAC (VOLTAREN) 50 MG EC TABLET    Take 1 tablet (50 mg total) by mouth 2 (two) times daily.   METHOCARBAMOL (ROBAXIN) 500 MG TABLET    Take 1 tablet (500 mg total) by mouth 2 (two) times daily.     Lonia SkinnerLeslie K LaneSofia, PA-C 05/02/16 1059    Blane OharaJoshua Zavitz, MD 05/02/16 [phone]

## 2016-05-02 NOTE — ED Triage Notes (Signed)
Pt reports fell off of the bed Saturday night and c/o pain in lower back.

## 2016-05-02 NOTE — ED Notes (Signed)
Returned from CT. Pt ambulatory

## 2016-07-06 ENCOUNTER — Encounter (HOSPITAL_COMMUNITY): Payer: Self-pay

## 2016-07-06 ENCOUNTER — Emergency Department (HOSPITAL_COMMUNITY)
Admission: EM | Admit: 2016-07-06 | Discharge: 2016-07-06 | Disposition: A | Discharge status: Home / Self Care | Payer: Self-pay | Attending: Emergency Medicine | Admitting: Emergency Medicine

## 2016-07-06 DIAGNOSIS — M5441 Lumbago with sciatica, right side: Secondary | ICD-10-CM

## 2016-07-06 DIAGNOSIS — Z79899 Other long term (current) drug therapy: Secondary | ICD-10-CM | POA: Insufficient documentation

## 2016-07-06 DIAGNOSIS — M545 Low back pain: Secondary | ICD-10-CM | POA: Insufficient documentation

## 2016-07-06 DIAGNOSIS — F17210 Nicotine dependence, cigarettes, uncomplicated: Secondary | ICD-10-CM | POA: Insufficient documentation

## 2016-07-06 DIAGNOSIS — W109XXD Fall (on) (from) unspecified stairs and steps, subsequent encounter: Secondary | ICD-10-CM | POA: Insufficient documentation

## 2016-07-06 MED ORDER — KETOROLAC TROMETHAMINE 30 MG/ML IJ SOLN
15.0000 mg | Freq: Once | INTRAMUSCULAR | Status: AC
  Administered 2016-07-06: 15 mg via INTRAMUSCULAR
  Filled 2016-07-06: qty 1

## 2016-07-06 MED ORDER — HYDROMORPHONE HCL 1 MG/ML IJ SOLN
0.7500 mg | Freq: Once | INTRAMUSCULAR | Status: AC
  Administered 2016-07-06: 0.75 mg via INTRAMUSCULAR
  Filled 2016-07-06: qty 1

## 2016-07-06 MED ORDER — DEXAMETHASONE 4 MG PO TABS
12.0000 mg | ORAL_TABLET | Freq: Once | ORAL | Status: AC
  Administered 2016-07-06: 12 mg via ORAL
  Filled 2016-07-06: qty 3

## 2016-07-06 MED ORDER — MELOXICAM 15 MG PO TABS: 15.0000 mg | ORAL_TABLET | Freq: Every day | ORAL | 0 refills | Status: DC

## 2016-07-06 NOTE — ED Triage Notes (Signed)
Pt in by ems for pt with lower back pain.  Pt states he fell down some steps about a month ago and hurt his back...seen here for same.

## 2016-07-10 NOTE — ED Provider Notes (Signed)
[facility]-EMERGENCY DEPT Provider Note   CSN: [id number] Arrival date & time: 07/06/16  0408     History   Chief Complaint Chief Complaint  Patient presents with  . Back Pain    HPI Peter Wiley is a 25 y.o. male.  HPI   25 year old male with back pain. Patient reports persistent pain after mechanical fall months ago. Worsening over the past 2 days. Worse with movement. He reports taking NSAIDs with some improvement. He denies any trauma since then. Acute urinary complaints. No acute numbness, tingling focal loss of strength. He does report a past history of drug abuse. Denies any recently though. No fevers or chills.   Past Medical History:  Diagnosis Date  . Drug abuse   . Heart murmur     There are no active problems to display for this patient.   Past Surgical History:  Procedure Laterality Date  . WRIST SURGERY         Home Medications    Prior to Admission medications   Medication Sig Start Date End Date Taking? Authorizing Provider  Buprenorphine HCl-Naloxone HCl (SUBOXONE) 8-2 MG FILM Place 1 Film under the tongue daily.    Historical Provider, MD  diclofenac (VOLTAREN) 50 MG EC tablet Take 1 tablet (50 mg total) by mouth 2 (two) times daily. 05/02/16   Elson AreasLeslie K Sofia, PA-C  meloxicam (MOBIC) 15 MG tablet Take 1 tablet (15 mg total) by mouth daily. 07/06/16   Raeford RazorStephen Kilyn Maragh, MD  methocarbamol (ROBAXIN) 500 MG tablet Take 1 tablet (500 mg total) by mouth 2 (two) times daily. 05/02/16   Elson AreasLeslie K Sofia, PA-C    Family History History reviewed. No pertinent family history.  Social History Social History  Substance Use Topics  . Smoking status: Current Every Day Smoker    Packs/day: 1.00    Types: Cigarettes  . Smokeless tobacco: Never Used  . Alcohol use Yes     Comment: occ     Allergies   Keflex [cephalexin]; Neosporin [neomycin-bacitracin zn-polymyx]; and Sulfa antibiotics   Review of Systems Review of Systems  All systems reviewed and negative,  other than as noted in HPI.   Physical Exam Updated Vital Signs BP 126/86 (BP Location: Right Arm)   Pulse 107   Temp 98.1 F (36.7 C) (Oral)   Resp 18   Ht 6' (1.829 m)   Wt 158 lb (71.7 kg)   SpO2 100%   BMI 21.43 kg/m   Physical Exam  Constitutional: He appears well-developed and well-nourished. No distress.  HENT:  Head: Normocephalic and atraumatic.  Eyes: Conjunctivae are normal. Right eye exhibits no discharge. Left eye exhibits no discharge.  Neck: Neck supple.  Cardiovascular: Normal rate, regular rhythm and normal heart sounds.  Exam reveals no gallop and no friction rub.   No murmur heard. Pulmonary/Chest: Effort normal and breath sounds normal. No respiratory distress.  Abdominal: Soft. He exhibits no distension. There is no tenderness.  Musculoskeletal: He exhibits no edema or tenderness.  Mild tenderness to palpation and lower back in the midline mid to lower lumbar region and paraspinally. No concerning skin lesions noted. Reports increased pain with right hip flexion. Has normal strength in bilateral lower extremities. Sensation is intact to light touch.  Neurological: He is alert.  Skin: Skin is warm and dry.  Psychiatric: He has a normal mood and affect. His behavior is normal. Thought content normal.  Nursing note and vitals reviewed.    ED Treatments / Results  Labs (all labs  ordered are listed, but only abnormal results are displayed) Labs Reviewed - No data to display  EKG  EKG Interpretation None       Radiology No results found.  Procedures Procedures (including critical care time)  Medications Ordered in ED Medications  HYDROmorphone (DILAUDID) injection 0.75 mg (0.75 mg Intramuscular Given 07/06/16 0510)  ketorolac (TORADOL) 30 MG/ML injection 15 mg (15 mg Intramuscular Given 07/06/16 0509)  dexamethasone (DECADRON) tablet 12 mg (12 mg Oral Given 07/06/16 0510)     Initial Impression / Assessment and Plan / ED Course  I have  reviewed the triage vital signs and the nursing notes.  Pertinent labs & imaging results that were available during my care of the patient were reviewed by me and considered in my medical decision making (see chart for details).  Clinical Course     25 year old male with lower back pain. Suspect musculoskeletal in etiology. There is somewhat of a concern given the persistent symptoms or the past several weeks and his history of drug use. Consider vertebral osteomyelitis/discitis suspicion overall this point is fairly low. I would like to continue treatment symptomatically. Strict return precautions were discussed with him. He needs to follow-up with his symptoms do not start to improve in the next couple days or starts developing fever, neuro deficits or other concerning symptoms.  Final Clinical Impressions(s) / ED Diagnoses   Final diagnoses:  Acute right-sided low back pain with right-sided sciatica    New Prescriptions Discharge Medication List as of 07/06/2016  5:26 AM    START taking these medications   Details  meloxicam (MOBIC) 15 MG tablet Take 1 tablet (15 mg total) by mouth daily., Starting Thu 07/06/2016, Print         Raeford RazorStephen Sherri Mcarthy, MD 07/10/16 1252

## 2016-07-13 ENCOUNTER — Encounter (HOSPITAL_COMMUNITY): Payer: Self-pay | Admitting: Emergency Medicine

## 2016-07-13 ENCOUNTER — Ambulatory Visit (HOSPITAL_COMMUNITY): Payer: Self-pay

## 2016-07-13 ENCOUNTER — Emergency Department (HOSPITAL_COMMUNITY)
Admission: EM | Admit: 2016-07-13 | Discharge: 2016-07-13 | Disposition: A | Discharge status: Home / Self Care | Payer: Self-pay | Attending: Emergency Medicine | Admitting: Emergency Medicine

## 2016-07-13 DIAGNOSIS — M549 Dorsalgia, unspecified: Secondary | ICD-10-CM | POA: Insufficient documentation

## 2016-07-13 DIAGNOSIS — Z5321 Procedure and treatment not carried out due to patient leaving prior to being seen by health care provider: Secondary | ICD-10-CM | POA: Insufficient documentation

## 2016-07-13 DIAGNOSIS — F1110 Opioid abuse, uncomplicated: Secondary | ICD-10-CM | POA: Insufficient documentation

## 2016-07-13 DIAGNOSIS — Z79899 Other long term (current) drug therapy: Secondary | ICD-10-CM | POA: Insufficient documentation

## 2016-07-13 DIAGNOSIS — F17210 Nicotine dependence, cigarettes, uncomplicated: Secondary | ICD-10-CM | POA: Insufficient documentation

## 2016-07-13 DIAGNOSIS — G8929 Other chronic pain: Secondary | ICD-10-CM | POA: Insufficient documentation

## 2016-07-13 NOTE — ED Notes (Signed)
Patient and significant other left room and proceeded to exit. Patient stated "This is fucking bullshit. One hour. I'm getting the fuck up out of here."

## 2016-07-13 NOTE — ED Triage Notes (Signed)
Pt reports intermittent back pain x 2 months. Pt states he has been seen here before for same. Pt c/o worsening symptoms with radiation down both legs.

## 2016-11-01 ENCOUNTER — Emergency Department (HOSPITAL_COMMUNITY)
Admission: EM | Admit: 2016-11-01 | Discharge: 2016-11-01 | Disposition: A | Discharge status: Home / Self Care | Payer: Medicaid Other | Attending: Emergency Medicine | Admitting: Emergency Medicine

## 2016-11-01 ENCOUNTER — Encounter (HOSPITAL_COMMUNITY): Payer: Self-pay | Admitting: Emergency Medicine

## 2016-11-01 ENCOUNTER — Emergency Department (HOSPITAL_COMMUNITY): Payer: Medicaid Other

## 2016-11-01 DIAGNOSIS — F17210 Nicotine dependence, cigarettes, uncomplicated: Secondary | ICD-10-CM | POA: Diagnosis not present

## 2016-11-01 DIAGNOSIS — I861 Scrotal varices: Secondary | ICD-10-CM | POA: Diagnosis not present

## 2016-11-01 DIAGNOSIS — N50811 Right testicular pain: Secondary | ICD-10-CM | POA: Diagnosis present

## 2016-11-01 DIAGNOSIS — N50819 Testicular pain, unspecified: Secondary | ICD-10-CM

## 2016-11-01 LAB — URINALYSIS, ROUTINE W REFLEX MICROSCOPIC
GLUCOSE, UA: NEGATIVE mg/dL
HGB URINE DIPSTICK: NEGATIVE
Ketones, ur: NEGATIVE mg/dL
LEUKOCYTES UA: NEGATIVE
Nitrite: NEGATIVE
PH: 7 (ref 5.0–8.0)
PROTEIN: NEGATIVE mg/dL
SPECIFIC GRAVITY, URINE: 1.029 (ref 1.005–1.030)

## 2016-11-01 MED ORDER — IBUPROFEN 800 MG PO TABS
800.0000 mg | ORAL_TABLET | Freq: Once | ORAL | Status: AC
  Administered 2016-11-01: 800 mg via ORAL
  Filled 2016-11-01: qty 1

## 2016-11-01 MED ORDER — IBUPROFEN 800 MG PO TABS: 800.0000 mg | ORAL_TABLET | Freq: Three times a day (TID) | ORAL | 0 refills | Status: DC

## 2016-11-01 NOTE — ED Provider Notes (Signed)
[facility]ER[facility] DEPT Provider Note   CSN: [id number] Arrival date & time: 11/01/16  1808     History   Chief Complaint Chief Complaint  Patient presents with  . Testicle Pain    HPI Peter Wiley is a 26 y.o. male.  Patient presents to the emergency department with chief complaint of right testicle pain. He states that he has had the pain for several months. He states that it waxes and wanes. He reports that over the past week, the pain has progressively worsened. He does report new sexual contacts, but denies any penile discharge or dysuria. He denies any fevers or chills. There are no modifying factors. He has not taken anything for his symptoms.   The history is provided by the patient. No language interpreter was used.    Past Medical History:  Diagnosis Date  . Drug abuse   . Heart murmur     There are no active problems to display for this patient.   Past Surgical History:  Procedure Laterality Date  . WRIST SURGERY         Home Medications    Prior to Admission medications   Medication Sig Start Date End Date Taking? Authorizing Provider  Buprenorphine HCl-Naloxone HCl (SUBOXONE) 8-2 MG FILM Place 1 Film under the tongue daily.    Historical Provider, MD  diclofenac (VOLTAREN) 50 MG EC tablet Take 1 tablet (50 mg total) by mouth 2 (two) times daily. 05/02/16   Elson Areas, PA-C  meloxicam (MOBIC) 15 MG tablet Take 1 tablet (15 mg total) by mouth daily. 07/06/16   Raeford Razor, MD  methocarbamol (ROBAXIN) 500 MG tablet Take 1 tablet (500 mg total) by mouth 2 (two) times daily. 05/02/16   Elson Areas, PA-C    Family History No family history on file.  Social History Social History  Substance Use Topics  . Smoking status: Current Every Day Smoker    Packs/day: 1.00    Types: Cigarettes  . Smokeless tobacco: Never Used  . Alcohol use Yes     Comment: occ     Allergies   Keflex [cephalexin]; Neosporin [neomycin-bacitracin zn-polymyx]; and Sulfa  antibiotics   Review of Systems Review of Systems  Genitourinary: Positive for testicular pain.  All other systems reviewed and are negative.    Physical Exam Updated Vital Signs BP 132/69   Pulse 92   Temp 98 F (36.7 C)   Resp 18   Ht 6' (1.829 m)   Wt 65.8 kg   SpO2 100%   BMI 19.67 kg/m   Physical Exam  Constitutional: He is oriented to person, place, and time. No distress.  HENT:  Head: Normocephalic and atraumatic.  Eyes: Conjunctivae and EOM are normal. Pupils are equal, round, and reactive to light.  Neck: No tracheal deviation present.  Cardiovascular: Normal rate.   Pulmonary/Chest: Effort normal. No respiratory distress.  Abdominal: Soft.  Genitourinary:  Genitourinary Comments: No testicular tenderness bilaterally, no palpable hernia, no visible swelling, no penile discharge, no masses or lesions  Musculoskeletal: Normal range of motion.  Neurological: He is alert and oriented to person, place, and time.  Skin: Skin is warm and dry. He is not diaphoretic.  Psychiatric: Judgment normal.  Nursing note and vitals reviewed.    ED Treatments / Results  Labs (all labs ordered are listed, but only abnormal results are displayed) Labs Reviewed  URINALYSIS, ROUTINE W REFLEX MICROSCOPIC - Abnormal; Notable for the following:       Result Value  Bilirubin Urine SMALL (*)    All other components within normal limits  GC/CHLAMYDIA PROBE AMP ([place]) NOT AT [facility]    EKG  EKG Interpretation None       Radiology US Scrotum  Result Date: 11/01/2016 CLINICAL DATA:  Right testicular pain for 2 months, of acute increase in pain today. EXAM: SCROTAL ULTRASOUND DOPPLER ULTRASOUND OF THE TESTICLES TECHNIQUE: Complete ultrasound examination of the testicles, epididymis, and other scrotal structures was performed. Color and spectral Doppler ultrasound were also utilized to evaluate blood flow to the testicles. COMPARISON:  None. FINDINGS: Right testicle  Measurements: 4.7 x 2.0 x 3.0 cm. Homogeneous echotexture. Normal blood flow. No mass or microlithiasis visualized. Left testicle Measurements: 4.5 x 2.5 x 2.8 cm. Homogeneous echotexture. Normal blood flow. No mass or microlithiasis visualized. Right epididymis:  Normal in size and appearance. Left epididymis:  Normal in size and appearance. Hydrocele:  None visualized. Varicocele:  Small bilateral. Pulsed Doppler interrogation of both testes demonstrates normal low resistance arterial and venous waveforms bilaterally. IMPRESSION: 1. Small bilateral varicoceles. 2. Otherwise unremarkable scrotal ultrasound. Normal sonographic appearance of both testes with normal blood flow. Electronically Signed   By: Rubye Oaks M.D.   On: 11/01/2016 19:54   [place] Art/ven Flow Abd Pelv Doppler  Result Date: 11/01/2016 CLINICAL DATA:  Right testicular pain for 2 months, of acute increase in pain today. EXAM: SCROTAL ULTRASOUND DOPPLER ULTRASOUND OF THE TESTICLES TECHNIQUE: Complete ultrasound examination of the testicles, epididymis, and other scrotal structures was performed. Color and spectral Doppler ultrasound were also utilized to evaluate blood flow to the testicles. COMPARISON:  None. FINDINGS: Right testicle Measurements: 4.7 x 2.0 x 3.0 cm. Homogeneous echotexture. Normal blood flow. No mass or microlithiasis visualized. Left testicle Measurements: 4.5 x 2.5 x 2.8 cm. Homogeneous echotexture. Normal blood flow. No mass or microlithiasis visualized. Right epididymis:  Normal in size and appearance. Left epididymis:  Normal in size and appearance. Hydrocele:  None visualized. Varicocele:  Small bilateral. Pulsed Doppler interrogation of both testes demonstrates normal low resistance arterial and venous waveforms bilaterally. IMPRESSION: 1. Small bilateral varicoceles. 2. Otherwise unremarkable scrotal ultrasound. Normal sonographic appearance of both testes with normal blood flow. Electronically Signed   By: Rubye Oaks M.D.   On: 11/01/2016 19:54    Procedures Procedures (including critical care time)  Medications Ordered in ED Medications  ibuprofen (ADVIL,MOTRIN) tablet 800 mg (not administered)     Initial Impression / Assessment and Plan / ED Course  I have reviewed the triage vital signs and the nursing notes.  Pertinent labs & imaging results that were available during my care of the patient were reviewed by me and considered in my medical decision making (see chart for details).     Patient with right testicle pain times several months. The pain waxes and wanes. It is worsening today. He denies any dysuria or disc discharge from the penis.  Urinalysis is negative. Gonorrhea and chlamydia test is pending. Ultrasound of scrotum shows bilateral small varicoceles, no evidence of torsion or epididymitis. I have low suspicion for torsing/detorsing testicles.  Case discussed with Dr. Jacqulyn Bath, who agrees with plan for urology follow-up for varicoceles. Patient understands and agrees the plan. He is stable and ready for discharge.  Final Clinical Impressions(s) / ED Diagnoses   Final diagnoses:  Testicle pain  Varicocele    New Prescriptions Discharge Medication List as of 11/01/2016  8:49 PM    START taking these medications   Details  ibuprofen (  ADVIL,MOTRIN) 800 MG tablet Take 1 tablet (800 mg total) by mouth 3 (three) times daily., Starting Wed 11/01/2016, Print         Roxy HorsemanRobert Anshul Meddings, PA-C 11/01/16 2109    Maia PlanJoshua G Long, MD 11/02/16 [phone]

## 2016-11-01 NOTE — ED Triage Notes (Signed)
Having testicle pain for last few months.  Pain is to right testicle, rating pain 10/10.

## 2016-11-02 LAB — GC/CHLAMYDIA PROBE AMP (~~LOC~~) NOT AT ARMC
Chlamydia: NEGATIVE
Neisseria Gonorrhea: NEGATIVE

## 2016-11-03 ENCOUNTER — Ambulatory Visit: Payer: Self-pay | Admitting: Family

## 2016-11-06 ENCOUNTER — Encounter: Payer: Self-pay | Admitting: General Practice

## 2016-11-15 ENCOUNTER — Ambulatory Visit: Payer: Self-pay | Admitting: Family

## 2016-11-16 ENCOUNTER — Encounter: Payer: Self-pay | Admitting: Family

## 2016-11-20 ENCOUNTER — Encounter: Payer: Self-pay | Admitting: Family

## 2016-11-20 ENCOUNTER — Ambulatory Visit: Payer: Self-pay | Admitting: Family

## 2017-02-15 ENCOUNTER — Ambulatory Visit (INDEPENDENT_AMBULATORY_CARE_PROVIDER_SITE_OTHER): Payer: Self-pay | Admitting: Orthopaedic Surgery

## 2017-02-15 ENCOUNTER — Encounter (INDEPENDENT_AMBULATORY_CARE_PROVIDER_SITE_OTHER): Payer: Self-pay | Admitting: Orthopaedic Surgery

## 2017-02-15 VITALS — BP 113/80 | HR 93 | Ht 72.0 in | Wt 140.0 lb

## 2017-02-15 DIAGNOSIS — G8929 Other chronic pain: Secondary | ICD-10-CM

## 2017-02-15 DIAGNOSIS — M545 Low back pain: Secondary | ICD-10-CM

## 2017-02-15 MED ORDER — DIAZEPAM 5 MG PO TABS: ORAL_TABLET | ORAL | 0 refills | Status: DC

## 2017-02-22 NOTE — Progress Notes (Signed)
Office Visit Note   Patient: Peter Wiley           Date of Birth: [birth year]           MRN: [id number] Visit Date: 02/15/2017              Requested by: No referring provider defined for this encounter. PCP: Health, [place]: Visit Diagnoses:  1. Chronic bilateral low back pain, with sciatica presence unspecified     Plan: Patient likely has x-ray sequela changes at L4-5 from previous discitis likely due to his long history of heroin injections and previous subcutaneous abscesses. Will obtain an MRI scan to rule out the epidural abscess formation or any evidence residual infection and office follow-up after scan.  Follow-Up Instructions: No Follow-up on file.   Orders:  Orders Placed This Encounter  Procedures  . MR Lumbar Spine w/o contrast  . CBC with Differential  . Sed Rate (ESR)  . C-reactive protein   Meds ordered this encounter  Medications  . diazepam (VALIUM) 5 MG tablet    Sig: Take one tablet one hour prior to MRI, repeat if needed    Dispense:  2 tablet    Refill:  0      Procedures: No procedures performed   Clinical Data: No additional findings.   Subjective: Chief Complaint  Patient presents with  . Lower Back - Pain    HPI 26 year old male here with his girlfriend. Patient states he was a former drug user. He had injected heroin also crystal meth user and now is on Suboxone. History is somewhat conflicting he states he is on it but he states he doesn't have a currently. He is also on gabapentin also takes ibuprofen. 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 He is on oral antibiotics for these infections in the past including 2015 2016. He's been to [place] hospitals [place] also [place]. He states he fell down flight of stairs one year ago. States his back is hurting. He is able to stand walk heel and toe  walk and forward fingertips to mid tibia resume upright position no lumbar spasms. States his pain lumbosacral junction. No fever or chills.  Review of Systems patient has multiple tattoos positive history for heroin injections in the past meth use. He states he is on Suboxone. Previous wrist injury and subcutaneous abscesses from IV drug injections. Positive history for MRSA otherwise history negative as it pertains history of present illness.   Objective: Vital Signs: BP 113/80   Pulse 93   Ht 6' (1.829 m)   Wt 140 lb (63.5 kg)   BMI 18.99 kg/m   Physical Exam  Constitutional: He is oriented to person, place, and time. He appears well-developed and well-nourished.  HENT:  Head: Normocephalic and atraumatic.  Eyes: EOM are normal. Pupils are equal, round, and reactive to light.  Neck: No tracheal deviation present. No thyromegaly present.  Cardiovascular: Normal rate.   Pulmonary/Chest: Effort normal. He has no wheezes.  Abdominal: Soft. Bowel sounds are normal.  Musculoskeletal:  Healed needle tracks on his arms. Scarring from previous infections in the antecubital region. They straight leg raising in 90 and ankle jerk are intact no pain with hip range of motion no hip flexion pain.  Neurological: He is alert and oriented to person, place, and time.  Skin: Skin is warm and dry.  Capillary refill takes less than 2 seconds.  Psychiatric: He has a normal mood and affect. His behavior is normal. Judgment and thought content normal.    Ortho Exam patient has negative nerve root tension signs no lumbar spasms. His amateur without neurologic deficit.  Specialty Comments:  No specialty comments available.  Imaging: Outside x-rays reviewed if you lateral lumbar spine which shows a endplate irregularity at L4-5 with sclerosis which are suggestive of discitis.    PMFS History: There are no active problems to display for this patient.  Past Medical History:  Diagnosis Date  . Acid  reflux   . Drug abuse   . Heart murmur   . Hepatitis   . Migraines     No family history on file.  Past Surgical History:  Procedure Laterality Date  . WRIST SURGERY     Social History   Occupational History  . Not on file.   Social History Main Topics  . Smoking status: Current Every Day Smoker    Packs/day: 1.00    Types: Cigarettes  . Smokeless tobacco: Never Used  . Alcohol use Yes     Comment: occ  . Drug use: Yes    Types: Methamphetamines     Comment: heroin- former  . Sexual activity: Yes

## 2017-02-23 ENCOUNTER — Ambulatory Visit (HOSPITAL_COMMUNITY): Payer: Medicaid Other

## 2017-03-01 ENCOUNTER — Ambulatory Visit (INDEPENDENT_AMBULATORY_CARE_PROVIDER_SITE_OTHER): Payer: Self-pay | Admitting: Orthopaedic Surgery

## 2017-04-01 ENCOUNTER — Encounter (HOSPITAL_COMMUNITY): Payer: Self-pay | Admitting: Emergency Medicine

## 2017-04-01 ENCOUNTER — Emergency Department (HOSPITAL_COMMUNITY)
Admission: EM | Admit: 2017-04-01 | Discharge: 2017-04-02 | Disposition: A | Discharge status: Home / Self Care | Payer: Medicaid Other | Attending: Emergency Medicine | Admitting: Emergency Medicine

## 2017-04-01 DIAGNOSIS — Z79899 Other long term (current) drug therapy: Secondary | ICD-10-CM | POA: Insufficient documentation

## 2017-04-01 DIAGNOSIS — R4182 Altered mental status, unspecified: Secondary | ICD-10-CM | POA: Insufficient documentation

## 2017-04-01 DIAGNOSIS — F332 Major depressive disorder, recurrent severe without psychotic features: Secondary | ICD-10-CM | POA: Diagnosis not present

## 2017-04-01 DIAGNOSIS — R45851 Suicidal ideations: Secondary | ICD-10-CM | POA: Insufficient documentation

## 2017-04-01 DIAGNOSIS — F329 Major depressive disorder, single episode, unspecified: Secondary | ICD-10-CM | POA: Diagnosis present

## 2017-04-01 DIAGNOSIS — F1910 Other psychoactive substance abuse, uncomplicated: Secondary | ICD-10-CM | POA: Diagnosis not present

## 2017-04-01 DIAGNOSIS — F1290 Cannabis use, unspecified, uncomplicated: Secondary | ICD-10-CM | POA: Diagnosis not present

## 2017-04-01 DIAGNOSIS — F17210 Nicotine dependence, cigarettes, uncomplicated: Secondary | ICD-10-CM | POA: Insufficient documentation

## 2017-04-01 DIAGNOSIS — F32A Depression, unspecified: Secondary | ICD-10-CM

## 2017-04-01 DIAGNOSIS — F1120 Opioid dependence, uncomplicated: Secondary | ICD-10-CM | POA: Diagnosis not present

## 2017-04-01 HISTORY — DX: Aneurysm of heart: I25.3

## 2017-04-01 HISTORY — DX: Pneumothorax, unspecified: J93.9

## 2017-04-01 LAB — CBC WITH DIFFERENTIAL/PLATELET
BASOS ABS: 0 10*3/uL (ref 0.0–0.1)
Basophils Relative: 0 %
EOS ABS: 0.2 10*3/uL (ref 0.0–0.7)
EOS PCT: 4 %
HCT: 43.5 % (ref 39.0–52.0)
Hemoglobin: 14 g/dL (ref 13.0–17.0)
Lymphocytes Relative: 38 %
Lymphs Abs: 2.1 10*3/uL (ref 0.7–4.0)
MCH: 31.2 pg (ref 26.0–34.0)
MCHC: 32.2 g/dL (ref 30.0–36.0)
MCV: 96.9 fL (ref 78.0–100.0)
Monocytes Absolute: 0.4 10*3/uL (ref 0.1–1.0)
Monocytes Relative: 8 %
Neutro Abs: 2.7 10*3/uL (ref 1.7–7.7)
Neutrophils Relative %: 50 %
PLATELETS: 222 10*3/uL (ref 150–400)
RBC: 4.49 MIL/uL (ref 4.22–5.81)
RDW: 13.3 % (ref 11.5–15.5)
WBC: 5.4 10*3/uL (ref 4.0–10.5)

## 2017-04-01 LAB — COMPREHENSIVE METABOLIC PANEL
ALBUMIN: 4.4 g/dL (ref 3.5–5.0)
ALT: 165 U/L — ABNORMAL HIGH (ref 17–63)
AST: 88 U/L — AB (ref 15–41)
Alkaline Phosphatase: 87 U/L (ref 38–126)
Anion gap: 9 (ref 5–15)
BILIRUBIN TOTAL: 0.6 mg/dL (ref 0.3–1.2)
BUN: 10 mg/dL (ref 6–20)
CHLORIDE: 100 mmol/L — AB (ref 101–111)
CO2: 28 mmol/L (ref 22–32)
Calcium: 9.5 mg/dL (ref 8.9–10.3)
Creatinine, Ser: 0.82 mg/dL (ref 0.61–1.24)
GFR calc Af Amer: >60 mL/min (ref >60)
GFR calc non Af Amer: >60 mL/min (ref >60)
GLUCOSE: 92 mg/dL (ref 65–99)
POTASSIUM: 4.1 mmol/L (ref 3.5–5.1)
Sodium: 137 mmol/L (ref 135–145)
TOTAL PROTEIN: 8.3 g/dL — AB (ref 6.5–8.1)

## 2017-04-01 LAB — SALICYLATE LEVEL: Salicylate Lvl: <7 mg/dL (ref 2.8–30.0)

## 2017-04-01 LAB — RAPID URINE DRUG SCREEN, HOSP PERFORMED
AMPHETAMINES: POSITIVE — AB
BARBITURATES: NOT DETECTED
BENZODIAZEPINES: NOT DETECTED
Cocaine: NOT DETECTED
Opiates: NOT DETECTED
TETRAHYDROCANNABINOL: POSITIVE — AB

## 2017-04-01 LAB — ACETAMINOPHEN LEVEL

## 2017-04-01 LAB — ETHANOL: Alcohol, Ethyl (B): <5 mg/dL (ref <5)

## 2017-04-01 MED ORDER — CLONIDINE HCL 0.1 MG PO TABS: 0.1000 mg | ORAL_TABLET | Freq: Every day | ORAL | Status: DC

## 2017-04-01 MED ORDER — CLONIDINE HCL 0.1 MG PO TABS
0.1000 mg | ORAL_TABLET | Freq: Four times a day (QID) | ORAL | Status: DC
  Administered 2017-04-01 – 2017-04-02 (×3): 0.1 mg via ORAL
  Filled 2017-04-01 (×4): qty 1

## 2017-04-01 MED ORDER — ONDANSETRON 4 MG PO TBDP: 4.0000 mg | ORAL_TABLET | Freq: Four times a day (QID) | ORAL | Status: DC | PRN

## 2017-04-01 MED ORDER — NICOTINE 21 MG/24HR TD PT24
21.0000 mg | MEDICATED_PATCH | Freq: Every day | TRANSDERMAL | Status: DC
  Filled 2017-04-01 (×2): qty 1

## 2017-04-01 MED ORDER — LOPERAMIDE HCL 2 MG PO CAPS: 2.0000 mg | ORAL_CAPSULE | ORAL | Status: DC | PRN

## 2017-04-01 MED ORDER — TRAZODONE HCL 50 MG PO TABS
150.0000 mg | ORAL_TABLET | Freq: Every day | ORAL | Status: DC
  Administered 2017-04-01: 150 mg via ORAL
  Filled 2017-04-01: qty 3

## 2017-04-01 MED ORDER — CLONIDINE HCL 0.1 MG PO TABS: 0.1000 mg | ORAL_TABLET | Freq: Two times a day (BID) | ORAL | Status: DC

## 2017-04-01 MED ORDER — NAPROXEN 500 MG PO TABS
500.0000 mg | ORAL_TABLET | Freq: Two times a day (BID) | ORAL | Status: DC | PRN
  Administered 2017-04-02: 500 mg via ORAL
  Filled 2017-04-01: qty 1

## 2017-04-01 MED ORDER — METHOCARBAMOL 500 MG PO TABS
500.0000 mg | ORAL_TABLET | Freq: Three times a day (TID) | ORAL | Status: DC | PRN
  Administered 2017-04-02: 500 mg via ORAL
  Filled 2017-04-01: qty 1

## 2017-04-01 MED ORDER — DICYCLOMINE HCL 20 MG PO TABS
20.0000 mg | ORAL_TABLET | Freq: Four times a day (QID) | ORAL | Status: DC | PRN
  Filled 2017-04-01: qty 1

## 2017-04-01 MED ORDER — HYDROXYZINE HCL 25 MG PO TABS
25.0000 mg | ORAL_TABLET | Freq: Four times a day (QID) | ORAL | Status: DC | PRN
  Administered 2017-04-01 – 2017-04-02 (×2): 25 mg via ORAL
  Filled 2017-04-01 (×2): qty 1

## 2017-04-01 NOTE — ED Provider Notes (Signed)
Pt under IVC. TTS has evaluated pt: recommends inpt treatment. [facility] for [facility]. [facility] ED[facility] Dr. Estell HarpinZammit called with report and agreeable with plan.    Samuel JesterMcManus, Daaiyah Baumert, DO 04/01/17 2159

## 2017-04-01 NOTE — BH Assessment (Signed)
Tele Assessment Note   Peter Wiley is an 26 y.o. male presents to [facility] accompanied by [facility] police. He states his mother became worried after she found a note he left her. He said "I could see why she thought it was a Actor. It said she didn't deserve to have to worry about me."  He states he relapsed on Heroin yesterday and his fiance left due to the relapse. He reports being 10 months sober prior to yesterday. He states [place]. He reports his mother became worried when she could not find him. He denies current SI, HI and AVH. However, he reports attempting to hang himself in the past. He reports attempting 8-9 months ago but per chart review it was 2 weeks ago. He reports using marijuana 1-3 times per week. He does not have a current outpatient provider. He does not have a history of psychiatric hospitalizations.   Per Cherre Robins, NP pt meets inpatient criteria. TTS will seek placement.   Diagnosis: Major depression, Opiate use disorder  Past Medical History:  Past Medical History:  Diagnosis Date  . Acid reflux   . Drug abuse   . Heart aneurysm   . Heart murmur   . Hepatitis   . Migraines   . Pneumothorax     Past Surgical History:  Procedure Laterality Date  . CARDIAC SURGERY    . CHEST TUBE INSERTION    . WRIST SURGERY      Family History: History reviewed. No pertinent family history.  Social History:  reports that he has been smoking Cigarettes.  He has been smoking about 1.00 pack per day. He has never used smokeless tobacco. He reports that he drinks alcohol. He reports that he uses drugs, including Methamphetamines, Marijuana, and IV.  Additional Social History:  Alcohol / Drug Use Pain Medications: See MAR  Prescriptions: See MAR  Over the Counter: See MAR History of alcohol / drug use?: Yes Substance #1 Name of Substance 1: Heroin  1 - Age of First Use: Unknown  1 - Amount (size/oz):  $20  1 - Last Use / Amount: Yesterday   CIWA: CIWA-Ar BP: 128/68 Pulse Rate: 76 COWS:    PATIENT STRENGTHS: (choose at least two) Average or above average intelligence Capable of independent living Communication skills  Allergies:  Allergies  Allergen Reactions  . Doxycycline   . Keflex [Cephalexin]   . Neosporin [Neomycin-Bacitracin Zn-Polymyx] Other (See Comments)    REACTION: causes more irritation to skin  . Penicillins   . Sulfa Antibiotics Nausea And Vomiting    Home Medications:  (Not in a hospital admission)  OB/GYN Status:  No LMP for male patient.  General Assessment Data Location of Assessment: [facility] Services TTS Assessment: In system Is this a Tele or Face-to-Face Assessment?: Tele Assessment Is this an Initial Assessment or a Re-assessment for this encounter?: Initial Assessment Marital status: Long term relationship Is patient pregnant?: No Living Arrangements: Parent Can pt return to current living arrangement?: Yes Admission Status: Involuntary Is patient capable of signing voluntary admission?: Yes Referral Source: Other Insurance type: Medicaid   Medical Screening Exam [facility] Walk-in ONLY) Medical Exam completed: Yes  Crisis Care Plan Living Arrangements: Parent Name of Psychiatrist: None  Name of Therapist: NA  Education Status Is patient currently in school?: No Highest grade of school patient has completed: 12th  Name of school: NA Contact person: NA  Risk  to self with the past 6 months Suicidal Ideation: No-Not Currently/Within Last 6 Months Has patient been a risk to self within the past 6 months prior to admission? : Yes Suicidal Intent: No-Not Currently/Within Last 6 Months Has patient had any suicidal intent within the past 6 months prior to admission? : No Is patient at risk for suicide?: Yes Suicidal Plan?: No-Not Currently/Within Last 6 Months Has patient had any suicidal plan within the past 6 months prior to  admission? : Yes Access to Means: Yes Specify Access to Suicidal Means: Drugs  What has been your use of drugs/alcohol within the last 12 months?: Relapsed on Heroin yesterday  Previous Attempts/Gestures: Yes How many times?: 1 Triggers for Past Attempts: Spouse contact Intentional Self Injurious Behavior: None Family Suicide History: Unknown Recent stressful life event(s): Conflict (Comment) Persecutory voices/beliefs?: No Depression: Yes Depression Symptoms: Isolating, Loss of interest in usual pleasures, Feeling angry/irritable Substance abuse history and/or treatment for substance abuse?: Yes Suicide prevention information given to non-admitted patients: Yes  Risk to Others within the past 6 months Homicidal Ideation: No Does patient have any lifetime risk of violence toward others beyond the six months prior to admission? : No Thoughts of Harm to Others: No Current Homicidal Intent: No Current Homicidal Plan: No Access to Homicidal Means: No History of harm to others?: No Assessment of Violence: None Noted Does patient have access to weapons?: No Criminal Charges Pending?: No Does patient have a court date: No Is patient on probation?: No  Psychosis Hallucinations: None noted Delusions: None noted  Mental Status Report Appearance/Hygiene: In scrubs Eye Contact: Good Motor Activity: Unremarkable Speech: Logical/coherent Level of Consciousness: Alert Mood: Anxious Affect: Anxious Anxiety Level: Moderate Thought Processes: Coherent, Relevant Judgement: Unimpaired Orientation: Person, Place, Time, Situation Obsessive Compulsive Thoughts/Behaviors: None  Cognitive Functioning Concentration: Normal Memory: Recent Intact, Remote Intact IQ: Average Insight: Fair Impulse Control: Poor Appetite: Good Weight Loss: 0 Weight Gain: 0 Sleep: No Change Vegetative Symptoms: Staying in bed  ADLScreening [facility]) Patient's cognitive ability adequate to  safely complete daily activities?: Yes Patient able to express need for assistance with ADLs?: Yes Independently performs ADLs?: Yes (appropriate for developmental age)  Prior Inpatient Therapy Prior Inpatient Therapy: No  Prior Outpatient Therapy Prior Outpatient Therapy: Yes Prior Therapy Dates: 10 months ago Prior Therapy Facilty/Provider(s): [facility]  Reason for Treatment: Detox Does patient have an ACCT team?: No Does patient have Intensive In-House Services?  : No Does patient have [facility] services? : No Does patient have P4CC services?: No  ADL Screening (condition at time of admission) Patient's cognitive ability adequate to safely complete daily activities?: Yes Is the patient deaf or have difficulty hearing?: No Does the patient have difficulty seeing, even when wearing glasses/contacts?: No Does the patient have difficulty concentrating, remembering, or making decisions?: No Patient able to express need for assistance with ADLs?: Yes Does the patient have difficulty dressing or bathing?: No Independently performs ADLs?: Yes (appropriate for developmental age) Does the patient have difficulty walking or climbing stairs?: No Weakness of Legs: None Weakness of Arms/Hands: None  Home Assistive Devices/Equipment Home Assistive Devices/Equipment: None  Therapy Consults (therapy consults require a physician order) PT Evaluation Needed: No OT Evalulation Needed: No SLP Evaluation Needed: No Abuse/Neglect Assessment (Assessment to be complete while patient is alone) Physical Abuse: Denies Verbal Abuse: Denies Sexual Abuse: Denies Exploitation of patient/patient's resources: Denies Self-Neglect: Denies Values / Beliefs Cultural Requests During Hospitalization: None Spiritual Requests During Hospitalization: None Consults Spiritual Care Consult  Needed: No Social Work Consult Needed: No Merchant navy officerAdvance Directives (For Healthcare) Does Patient Have a Programmer, multimediaMedical Advance Directive?:  No Would patient like information on creating a medical advance directive?: No - Patient declined Nutrition Screen- [facility] Patient's home diet: Regular Has the patient recently lost weight without trying?: No Has the patient been eating poorly because of a decreased appetite?: No Malnutrition Screening Tool Score: 0  Additional Information 1:1 In Past 12 Months?: No CIRT Risk: No Elopement Risk: No Does patient have medical clearance?: Yes     Disposition:  Disposition Initial Assessment Completed for this Encounter: Yes Disposition of Patient: Inpatient treatment program Type of inpatient treatment program: Adult  Rondall AllegraCandace L Chayah Mckee MSW, LCSW  04/01/2017 6:16 PM

## 2017-04-01 NOTE — ED Triage Notes (Addendum)
[place] [facility] [place]. Alert and oriented. Patient brought to ER for SI. Per sheriff, they were called by parents after patient "went missing" and left a "goodbye note"  And text message stating that he "would be gone for good." Patient to be emergency IVC-another officer bringing note and copy of text. Patient has hx of hanging himself. Patient denies being suicidal but states he is depressed and homicidal. Patient states that he "disappeared to clear his mind." Patient reports a lot of stressors in his life.

## 2017-04-01 NOTE — ED Provider Notes (Signed)
[facility]Y DEPT Provider Note   CSN: [id number] Arrival date & time: 04/01/17  1442     History   Chief Complaint Chief Complaint  Patient presents with  . V70.1   Level V caveat psychiatric illness HPI Peter Wiley is a 26 y.o. male.  HPI patient brought in by law enforcement. Law enforcement was reportedly called by parents after patient "went missing. He also had a goodbye note which accompanies him here. Patient admits to injecting himself with heroin last time yesterday. No treatment prior to coming here. He admits to being depressed presently statin "I have a lot of  Sh-t going on." He reports that he is currently on Zoloft, and has been compliant Patient reportedly tried to hang himself 2 weeks ago Past Medical History:  Diagnosis Date  . Acid reflux   . Drug abuse   . Heart aneurysm   . Heart murmur   . Hepatitis   . Migraines   . Pneumothorax     There are no active problems to display for this patient.   Past Surgical History:  Procedure Laterality Date  . CARDIAC SURGERY    . CHEST TUBE INSERTION    . WRIST SURGERY         Home Medications    Prior to Admission medications   Medication Sig Start Date End Date Taking? Authorizing Provider  Buprenorphine HCl-Naloxone HCl (SUBOXONE) 8-2 MG FILM Place 1 Film under the tongue daily.    [provider]  diazepam (VALIUM) 5 MG tablet Take one tablet one hour prior to MRI, repeat if needed 02/15/17   Eldred Manges, MD  diclofenac (VOLTAREN) 50 MG EC tablet Take 1 tablet (50 mg total) by mouth 2 (two) times daily. Patient not taking: Reported on 02/15/2017 05/02/16   Elson Areas, PA-C  gabapentin (NEURONTIN) 100 MG capsule Take 100 mg by mouth 3 (three) times daily.    [provider]  ibuprofen (ADVIL,MOTRIN) 800 MG tablet Take 1 tablet (800 mg total) by mouth 3 (three) times daily. 11/01/16   Roxy Horseman, PA-C  meloxicam (MOBIC) 15 MG tablet Take 1 tablet (15 mg total) by mouth  daily. Patient not taking: Reported on 02/15/2017 07/06/16   Raeford Razor, MD  methocarbamol (ROBAXIN) 500 MG tablet Take 1 tablet (500 mg total) by mouth 2 (two) times daily. Patient not taking: Reported on 02/15/2017 05/02/16   Osie Cheeks    Family History History reviewed. No pertinent family history.  Social History Social History  Substance Use Topics  . Smoking status: Current Every Day Smoker    Packs/day: 1.00    Types: Cigarettes  . Smokeless tobacco: Never Used  . Alcohol use Yes     Comment: occ     Allergies   Doxycycline; Keflex [cephalexin]; Neosporin [neomycin-bacitracin zn-polymyx]; Penicillins; and Sulfa antibiotics   Review of Systems Review of Systems  Unable to perform ROS: Psychiatric disorder  Psychiatric/Behavioral: Positive for dysphoric mood.     Physical Exam Updated Vital Signs BP 107/72 (BP Location: Right Arm)   Pulse 86   Temp 98.4 F (36.9 C) (Oral)   Resp 18   Ht 6' (1.829 m)   Wt 63.5 kg (140 lb)   SpO2 99%   BMI 18.99 kg/m   Physical Exam  Constitutional: He appears well-developed and well-nourished. He appears distressed.  Mildly anxious.  HENT:  Head: Normocephalic and atraumatic.  Eyes: Pupils are equal, round, and reactive to light. Conjunctivae are normal.  Neck: Neck supple. No tracheal deviation present. No thyromegaly present.  Cardiovascular: Normal rate and regular rhythm.   No murmur heard. Pulmonary/Chest: Effort normal and breath sounds normal.  Abdominal: Soft. Bowel sounds are normal. He exhibits no distension. There is no tenderness.  Musculoskeletal: Normal range of motion. He exhibits no edema or tenderness.  Neurological: He is alert. No cranial nerve deficit. Coordination normal.  Gait normal  Skin: Skin is warm and dry. No rash noted.  Needle marks at left antecubital fossa, no signs of infection  Psychiatric: He has a normal mood and affect.  Nursing note and vitals reviewed.    ED  Treatments / Results  Labs (all labs ordered are listed, but only abnormal results are displayed) Labs Reviewed  COMPREHENSIVE METABOLIC PANEL  ETHANOL  RAPID URINE DRUG SCREEN, HOSP PERFORMED  CBC WITH DIFFERENTIAL/PLATELET  ACETAMINOPHEN LEVEL  SALICYLATE LEVEL    Date: 04/01/2017  Rate: 75  Rhythm: normal sinus rhythm  QRS Axis: normal  Intervals: normal  ST/T Wave abnormalities: normal  Conduction Disutrbances: none  Narrative Interpretation: unremarkable    EKG  EKG Interpretation None       Radiology No results found.  Procedures Procedures (including critical care time)  Medications Ordered in ED Medications - No data to display  Involuntary commitment affidavit followed by me and  first exam form followed by me Results for orders placed or performed during the hospital encounter of 04/01/17  Comprehensive metabolic panel  Result Value Ref Range   Sodium 137 135 - 145 mmol/L   Potassium 4.1 3.5 - 5.1 mmol/L   Chloride 100 (L) 101 - 111 mmol/L   CO2 28 22 - 32 mmol/L   Glucose, Bld 92 65 - 99 mg/dL   BUN 10 6 - 20 mg/dL   Creatinine, Ser 2.720.82 0.61 - 1.24 mg/dL   Calcium 9.5 8.9 - 53.610.3 mg/dL   Total Protein 8.3 (H) 6.5 - 8.1 g/dL   Albumin 4.4 3.5 - 5.0 g/dL   AST 88 (H) 15 - 41 U/L   ALT 165 (H) 17 - 63 U/L   Alkaline Phosphatase 87 38 - 126 U/L   Total Bilirubin 0.6 0.3 - 1.2 mg/dL   GFR calc non Af Amer >60 >60 mL/min   GFR calc Af Amer >60 >60 mL/min   Anion gap 9 5 - 15  Ethanol  Result Value Ref Range   Alcohol, Ethyl (B) <5 <5 mg/dL  CBC with Diff  Result Value Ref Range   WBC 5.4 4.0 - 10.5 K/uL   RBC 4.49 4.22 - 5.81 MIL/uL   Hemoglobin 14.0 13.0 - 17.0 g/dL   HCT 64.443.5 03.439.0 - 74.252.0 %   MCV 96.9 78.0 - 100.0 fL   MCH 31.2 26.0 - 34.0 pg   MCHC 32.2 30.0 - 36.0 g/dL   RDW 59.513.3 63.811.5 - 75.615.5 %   Platelets 222 150 - 400 K/uL   Neutrophils Relative % 50 %   Neutro Abs 2.7 1.7 - 7.7 K/uL   Lymphocytes Relative 38 %   Lymphs Abs 2.1 0.7  - 4.0 K/uL   Monocytes Relative 8 %   Monocytes Absolute 0.4 0.1 - 1.0 K/uL   Eosinophils Relative 4 %   Eosinophils Absolute 0.2 0.0 - 0.7 K/uL   Basophils Relative 0 %   Basophils Absolute 0.0 0.0 - 0.1 K/uL  Acetaminophen level  Result Value Ref Range   Acetaminophen (Tylenol), Serum <10 (L) 10 - 30 ug/mL  Salicylate level  Result Value Ref Range   Salicylate Lvl <7.0 2.8 - 30.0 mg/dL   No results found. Initial Impression / Assessment and Plan / ED Course  I have reviewed the triage vital signs and the nursing notes.  Pertinent labs & imaging results that were available during my care of the patient were reviewed by me and considered in my medical decision making (see chart for details).     5:05 PM patient medically cleared for psychiatric evaluation.  Final Clinical Impressions(s) / ED Diagnoses  Diagnosis #1 suicidal ideation #2 substance abuse Final diagnoses:  None    New Prescriptions New Prescriptions   No medications on file     Doug SouJacubowitz, Macky Galik, MD 04/01/17 1710

## 2017-04-02 ENCOUNTER — Inpatient Hospital Stay (HOSPITAL_COMMUNITY)
Admission: AD | Admit: 2017-04-02 | Discharge: 2017-04-05 | DRG: 885 | Disposition: A | Discharge status: Home / Self Care | Payer: Medicaid Other | Attending: Psychiatry | Admitting: Psychiatry

## 2017-04-02 ENCOUNTER — Encounter (HOSPITAL_COMMUNITY): Payer: Self-pay | Admitting: *Deleted

## 2017-04-02 DIAGNOSIS — F1120 Opioid dependence, uncomplicated: Secondary | ICD-10-CM | POA: Diagnosis present

## 2017-04-02 DIAGNOSIS — F1510 Other stimulant abuse, uncomplicated: Secondary | ICD-10-CM | POA: Diagnosis present

## 2017-04-02 DIAGNOSIS — R45851 Suicidal ideations: Secondary | ICD-10-CM

## 2017-04-02 DIAGNOSIS — F332 Major depressive disorder, recurrent severe without psychotic features: Secondary | ICD-10-CM | POA: Diagnosis present

## 2017-04-02 DIAGNOSIS — F1210 Cannabis abuse, uncomplicated: Secondary | ICD-10-CM | POA: Diagnosis present

## 2017-04-02 DIAGNOSIS — K219 Gastro-esophageal reflux disease without esophagitis: Secondary | ICD-10-CM | POA: Diagnosis present

## 2017-04-02 DIAGNOSIS — F17210 Nicotine dependence, cigarettes, uncomplicated: Secondary | ICD-10-CM

## 2017-04-02 DIAGNOSIS — F419 Anxiety disorder, unspecified: Secondary | ICD-10-CM | POA: Diagnosis present

## 2017-04-02 DIAGNOSIS — F1910 Other psychoactive substance abuse, uncomplicated: Secondary | ICD-10-CM | POA: Diagnosis not present

## 2017-04-02 DIAGNOSIS — F1994 Other psychoactive substance use, unspecified with psychoactive substance-induced mood disorder: Secondary | ICD-10-CM | POA: Diagnosis present

## 2017-04-02 DIAGNOSIS — F1124 Opioid dependence with opioid-induced mood disorder: Secondary | ICD-10-CM | POA: Diagnosis present

## 2017-04-02 DIAGNOSIS — F1290 Cannabis use, unspecified, uncomplicated: Secondary | ICD-10-CM | POA: Diagnosis not present

## 2017-04-02 MED ORDER — TRAZODONE HCL 150 MG PO TABS
150.0000 mg | ORAL_TABLET | Freq: Every day | ORAL | Status: DC
  Administered 2017-04-02 – 2017-04-04 (×3): 150 mg via ORAL
  Filled 2017-04-02 (×6): qty 1

## 2017-04-02 MED ORDER — CLONIDINE HCL 0.1 MG PO TABS
0.1000 mg | ORAL_TABLET | Freq: Two times a day (BID) | ORAL | Status: DC
  Administered 2017-04-04 (×2): 0.1 mg via ORAL
  Filled 2017-04-02 (×4): qty 1

## 2017-04-02 MED ORDER — CLONIDINE HCL 0.1 MG PO TABS
0.1000 mg | ORAL_TABLET | Freq: Every day | ORAL | Status: DC
  Filled 2017-04-02 (×2): qty 1

## 2017-04-02 MED ORDER — LOPERAMIDE HCL 2 MG PO CAPS: 2.0000 mg | ORAL_CAPSULE | ORAL | Status: DC | PRN

## 2017-04-02 MED ORDER — ONDANSETRON 4 MG PO TBDP: 4.0000 mg | ORAL_TABLET | Freq: Four times a day (QID) | ORAL | Status: DC | PRN

## 2017-04-02 MED ORDER — METHOCARBAMOL 500 MG PO TABS
500.0000 mg | ORAL_TABLET | Freq: Three times a day (TID) | ORAL | Status: DC | PRN
  Administered 2017-04-03 – 2017-04-05 (×4): 500 mg via ORAL
  Filled 2017-04-02 (×4): qty 1

## 2017-04-02 MED ORDER — ALUM & MAG HYDROXIDE-SIMETH 200-200-20 MG/5ML PO SUSP: 30.0000 mL | ORAL | Status: DC | PRN

## 2017-04-02 MED ORDER — DICYCLOMINE HCL 20 MG PO TABS: 20.0000 mg | ORAL_TABLET | Freq: Four times a day (QID) | ORAL | Status: DC | PRN

## 2017-04-02 MED ORDER — MAGNESIUM HYDROXIDE 400 MG/5ML PO SUSP: 30.0000 mL | Freq: Every day | ORAL | Status: DC | PRN

## 2017-04-02 MED ORDER — ACETAMINOPHEN 325 MG PO TABS
650.0000 mg | ORAL_TABLET | Freq: Four times a day (QID) | ORAL | Status: DC | PRN
  Administered 2017-04-04: 650 mg via ORAL
  Filled 2017-04-02: qty 2

## 2017-04-02 MED ORDER — HYDROXYZINE HCL 25 MG PO TABS
25.0000 mg | ORAL_TABLET | Freq: Four times a day (QID) | ORAL | Status: DC | PRN
  Administered 2017-04-02 – 2017-04-05 (×6): 25 mg via ORAL
  Filled 2017-04-02 (×7): qty 1

## 2017-04-02 MED ORDER — NAPROXEN 500 MG PO TABS
500.0000 mg | ORAL_TABLET | Freq: Two times a day (BID) | ORAL | Status: DC | PRN
  Administered 2017-04-03 – 2017-04-05 (×4): 500 mg via ORAL
  Filled 2017-04-02 (×4): qty 1

## 2017-04-02 MED ORDER — CLONIDINE HCL 0.1 MG PO TABS
0.1000 mg | ORAL_TABLET | Freq: Four times a day (QID) | ORAL | Status: AC
  Administered 2017-04-02 – 2017-04-03 (×5): 0.1 mg via ORAL
  Filled 2017-04-02 (×8): qty 1

## 2017-04-02 MED ORDER — NICOTINE POLACRILEX 2 MG MT GUM
2.0000 mg | CHEWING_GUM | OROMUCOSAL | Status: DC | PRN
  Administered 2017-04-04 – 2017-04-05 (×3): 2 mg via ORAL
  Filled 2017-04-02 (×3): qty 1

## 2017-04-02 NOTE — ED Notes (Signed)
Hourly rounding reveals patient sleeping in room. No complaints, stable, in no acute distress. Q15 minute rounds and monitoring via Security Cameras to continue. 

## 2017-04-02 NOTE — Progress Notes (Signed)
Admission Note:  26 year old male who presents IVC, in no acute distress, for the treatment of SI and Substance Abuse. Patient appears flat and anxious. Patient was minimizing of reason for admission.  Patient denies SI and states "I wrote a letter to my mom because I relapsed and she thought it was a suicide note but it wasn't". Patient denies AVH.  Patient was guarded during the admission and interacted minimally.  Patient reports that he relapsed on Heroin after being sober for "about a year".  Patient reports previous suicide attempts.  Patient reports past medical hx of Hep C.  Patient currently lives with his parents and identifies his father as his support system.  [facility], patient would like to work on "myself" and "my attitude".  Skin was assessed. Patient had generalized scars.  Patient searched and no contraband found, POC and unit policies explained and understanding verbalized. Consents obtained. Patient had no additional questions or concerns.

## 2017-04-02 NOTE — Tx Team (Signed)
Initial Treatment Plan 04/02/2017 6:13 PM Peter Wiley [id number]:[id number]    PATIENT STRESSORS: Substance abuse   PATIENT STRENGTHS: Average or above average intelligence Communication skills Motivation for treatment/growth Supportive family/friends   PATIENT IDENTIFIED PROBLEMS: Substance Abuse  "Myself"  "My Attitude"                 DISCHARGE CRITERIA:  Ability to meet basic life and health needs Improved stabilization in mood, thinking, and/or behavior Motivation to continue treatment in a less acute level of care Need for constant or close observation no longer present Verbal commitment to aftercare and medication compliance Withdrawal symptoms are absent or subacute and managed without 24-hour nursing intervention  PRELIMINARY DISCHARGE PLAN: Attend 12-step recovery group Outpatient therapy Return to previous work or school arrangements  PATIENT/FAMILY INVOLVEMENT: This treatment plan has been presented to and reviewed with the patient, Peter Wiley.  The patient and family have been given the opportunity to ask questions and make suggestions.  Carleene OverlieMiddleton, Verlene Glantz P, RN 04/02/2017, 6:13 PM

## 2017-04-02 NOTE — BH Assessment (Signed)
[facility] Assessment Progress Note  Per Peter MinsMojeed Akintayo, MD, this pt requires psychiatric hospitalization.  Peter Heinrichina Tate, RN, [facility]; they will be ready to receive pt at 14:00.  Pt presents under IVC initiated by Peter Peter SouSam Jacubowitz, MD, and [facility]  Pt's nurse, Peter BraunKaren, has been notified, and agrees to call report to [phone].  Pt is to be transported via [employer] enforcement.   Peter Canninghomas Adewale Pucillo, MA Triage Specialist [phone]

## 2017-04-02 NOTE — ED Notes (Signed)
Pt denies SI, HI, and AVH. He says he would like Suboxone for withdrawal. He was given clonidine for withdrawal as prescribed and fluids. Pt remains safe with camera monitoring, rounding, and 15-minute checks in place.

## 2017-04-02 NOTE — ED Notes (Signed)
Pt. [facility] after screening for contraband. Report to include Situation, Background, Assessment and Recommendations from RN. Pt. Oriented to unit including Q15 minute rounds as well as the security cameras for their protection. Patient is alert and oriented, warm and dry in no acute distress. Patient denies SI, HI, and AVH. Pt. Encouraged to let me know if needs arise.

## 2017-04-02 NOTE — ED Notes (Signed)
[facility]. Pt was ambulatory and in no acute distress at discharge. Belongings were signed for by pt and given to law enforcement for transfer.

## 2017-04-02 NOTE — Consult Note (Signed)
[facility] Face-to-Face Psychiatry Consult   Reason for Consult: Suicide threat Referring Physician:  EDP Patient Identification: Peter Wiley MRN:  [id number] Principal Diagnosis: Major depressive disorder, recurrent severe without psychotic features [facility]) Diagnosis:   Patient Active Problem List   Diagnosis Date Noted  . Major depressive disorder, recurrent severe without psychotic features ([place]) [F33.2] 04/02/2017    Priority: High  . Opiate dependence, continuous ([place]) [F11.20] 04/02/2017    Priority: High    Total Time spent with patient: 45 minutes  Subjective:   Peter Wiley is a 26 y.o. male patient admitted with suicide threats.  HPI:  26 yo male who was IVC'd by his family after leaving a suicide note and not being able to be found.  [place]  His family was concerned and he tried to hang himself a few months ago but noted it was just two weeks ago.  He minimizes his depression and heroin use, despite using it regularly.  No homicidal ideations or hallucinations or withdrawal symptoms.  Not stable mentally and needs inpatient.  Past Psychiatric History: depression, substance abuse  Risk to Self: Suicidal Ideation: No-Not Currently/Within Last 6 Months Suicidal Intent: No-Not Currently/Within Last 6 Months Is patient at risk for suicide?: Yes Suicidal Plan?: No-Not Currently/Within Last 6 Months Access to Means: Yes Specify Access to Suicidal Means: Drugs  What has been your use of drugs/alcohol within the last 12 months?: Relapsed on Heroin yesterday  How many times?: 1 Triggers for Past Attempts: Spouse contact Intentional Self Injurious Behavior: None Risk to Others: Homicidal Ideation: No Thoughts of Harm to Others: No Current Homicidal Intent: No Current Homicidal Plan: No Access to Homicidal Means: No History of harm to others?: No Assessment of Violence: None Noted Does patient have access to weapons?: No Criminal Charges Pending?: No Does patient  have a court date: No Prior Inpatient Therapy: Prior Inpatient Therapy: No Prior Outpatient Therapy: Prior Outpatient Therapy: Yes Prior Therapy Dates: 10 months ago Prior Therapy Facilty/Provider(s): [facility]  Reason for Treatment: Detox Does patient have an ACCT team?: No Does patient have Intensive In-House Services?  : No Does patient have [facility] services? : No Does patient have P4CC services?: No  Past Medical History:  Past Medical History:  Diagnosis Date  . Acid reflux   . Drug abuse   . Heart aneurysm   . Heart murmur   . Hepatitis   . Migraines   . Pneumothorax     Past Surgical History:  Procedure Laterality Date  . CARDIAC SURGERY    . CHEST TUBE INSERTION    . WRIST SURGERY     Family History: History reviewed. No pertinent family history. Family Psychiatric  History: unknown Social History:  History  Alcohol Use  . Yes    Comment: occ     History  Drug Use  . Types: Methamphetamines, Marijuana, IV    Comment: heroin    Social History   Social History  . Marital status: Single    Spouse name: N/A  . Number of children: N/A  . Years of education: N/A   Social History Main Topics  . Smoking status: Current Every Day Smoker    Packs/day: 1.00    Types: Cigarettes  . Smokeless tobacco: Never Used  . Alcohol use Yes     Comment: occ  . Drug use: Yes    Types: Methamphetamines, Marijuana, IV     Comment: heroin  . Sexual activity: Yes   Other Topics Concern  .  None   Social History Narrative  . None   Additional Social History:    Allergies:   Allergies  Allergen Reactions  . Doxycycline   . Keflex [Cephalexin]   . Neosporin [Neomycin-Bacitracin Zn-Polymyx] Other (See Comments)    REACTION: causes more irritation to skin  . Penicillins   . Sulfa Antibiotics Nausea And Vomiting    Labs:  Results for orders placed or performed during the hospital encounter of 04/01/17 (from the past 48 hour(s))  Comprehensive metabolic panel      Status: Abnormal   Collection Time: 04/01/17  3:52 PM  Result Value Ref Range   Sodium 137 135 - 145 mmol/L   Potassium 4.1 3.5 - 5.1 mmol/L   Chloride 100 (L) 101 - 111 mmol/L   CO2 28 22 - 32 mmol/L   Glucose, Bld 92 65 - 99 mg/dL   BUN 10 6 - 20 mg/dL   Creatinine, Ser 0.82 0.61 - 1.24 mg/dL   Calcium 9.5 8.9 - 10.3 mg/dL   Total Protein 8.3 (H) 6.5 - 8.1 g/dL   Albumin 4.4 3.5 - 5.0 g/dL   AST 88 (H) 15 - 41 U/L   ALT 165 (H) 17 - 63 U/L   Alkaline Phosphatase 87 38 - 126 U/L   Total Bilirubin 0.6 0.3 - 1.2 mg/dL   GFR calc non Af Amer >60 >60 mL/min   GFR calc Af Amer >60 >60 mL/min    Comment: (NOTE) The eGFR has been calculated using the CKD EPI equation. This calculation has not been validated in all clinical situations. eGFR's persistently <60 mL/min signify possible Chronic Kidney Disease.    Anion gap 9 5 - 15  Ethanol     Status: None   Collection Time: 04/01/17  3:52 PM  Result Value Ref Range   Alcohol, Ethyl (B) <5 <5 mg/dL    Comment:        LOWEST DETECTABLE LIMIT FOR SERUM ALCOHOL IS 5 mg/dL FOR MEDICAL PURPOSES ONLY   CBC with Diff     Status: None   Collection Time: 04/01/17  3:52 PM  Result Value Ref Range   WBC 5.4 4.0 - 10.5 K/uL   RBC 4.49 4.22 - 5.81 MIL/uL   Hemoglobin 14.0 13.0 - 17.0 g/dL   HCT 43.5 39.0 - 52.0 %   MCV 96.9 78.0 - 100.0 fL   MCH 31.2 26.0 - 34.0 pg   MCHC 32.2 30.0 - 36.0 g/dL   RDW 13.3 11.5 - 15.5 %   Platelets 222 150 - 400 K/uL   Neutrophils Relative % 50 %   Neutro Abs 2.7 1.7 - 7.7 K/uL   Lymphocytes Relative 38 %   Lymphs Abs 2.1 0.7 - 4.0 K/uL   Monocytes Relative 8 %   Monocytes Absolute 0.4 0.1 - 1.0 K/uL   Eosinophils Relative 4 %   Eosinophils Absolute 0.2 0.0 - 0.7 K/uL   Basophils Relative 0 %   Basophils Absolute 0.0 0.0 - 0.1 K/uL  Acetaminophen level     Status: Abnormal   Collection Time: 04/01/17  3:52 PM  Result Value Ref Range   Acetaminophen (Tylenol), Serum <10 (L) 10 - 30 ug/mL     Comment:        THERAPEUTIC CONCENTRATIONS VARY SIGNIFICANTLY. A RANGE OF 10-30 ug/mL MAY BE AN EFFECTIVE CONCENTRATION FOR MANY PATIENTS. HOWEVER, SOME ARE BEST TREATED AT CONCENTRATIONS OUTSIDE THIS RANGE. ACETAMINOPHEN CONCENTRATIONS >150 ug/mL AT 4 HOURS AFTER INGESTION AND >50 ug/mL AT  12 HOURS AFTER INGESTION ARE OFTEN ASSOCIATED WITH TOXIC REACTIONS.   Salicylate level     Status: None   Collection Time: 04/01/17  3:52 PM  Result Value Ref Range   Salicylate Lvl <5.7 2.8 - 30.0 mg/dL  Urine rapid drug screen (hosp performed)     Status: Abnormal   Collection Time: 04/01/17  4:45 PM  Result Value Ref Range   Opiates NONE DETECTED NONE DETECTED   Cocaine NONE DETECTED NONE DETECTED   Benzodiazepines NONE DETECTED NONE DETECTED   Amphetamines POSITIVE (A) NONE DETECTED   Tetrahydrocannabinol POSITIVE (A) NONE DETECTED   Barbiturates NONE DETECTED NONE DETECTED    Comment:        DRUG SCREEN FOR MEDICAL PURPOSES ONLY.  IF CONFIRMATION IS NEEDED FOR ANY PURPOSE, NOTIFY LAB WITHIN 5 DAYS.        LOWEST DETECTABLE LIMITS FOR URINE DRUG SCREEN Drug Class       Cutoff (ng/mL) Amphetamine      1000 Barbiturate      200 Benzodiazepine   846 Tricyclics       962 Opiates          300 Cocaine          300 THC              50     Current Facility-Administered Medications  Medication Dose Route Frequency Provider Last Rate Last Dose  . cloNIDine (CATAPRES) tablet 0.1 mg  0.1 mg Oral QID Orlie Dakin, MD   0.1 mg at 04/02/17 9528   Followed by  . [START ON 04/03/2017] cloNIDine (CATAPRES) tablet 0.1 mg  0.1 mg Oral BID Orlie Dakin, MD       Followed by  . [START ON 04/06/2017] cloNIDine (CATAPRES) tablet 0.1 mg  0.1 mg Oral Daily Orlie Dakin, MD      . dicyclomine (BENTYL) tablet 20 mg  20 mg Oral Q6H PRN Orlie Dakin, MD      . hydrOXYzine (ATARAX/VISTARIL) tablet 25 mg  25 mg Oral Q6H PRN Orlie Dakin, MD   25 mg at 04/01/17 1949  . loperamide (IMODIUM)  capsule 2-4 mg  2-4 mg Oral PRN Orlie Dakin, MD      . methocarbamol (ROBAXIN) tablet 500 mg  500 mg Oral Q8H PRN Orlie Dakin, MD      . naproxen (NAPROSYN) tablet 500 mg  500 mg Oral BID PRN Orlie Dakin, MD      . nicotine (NICODERM CQ - dosed in mg/24 hours) patch 21 mg  21 mg Transdermal Daily Winfred Leeds, Sam, MD      . ondansetron (ZOFRAN-ODT) disintegrating tablet 4 mg  4 mg Oral Q6H PRN Orlie Dakin, MD      . traZODone (DESYREL) tablet 150 mg  150 mg Oral QHS Orlie Dakin, MD   150 mg at 04/01/17 2159   Current Outpatient Prescriptions  Medication Sig Dispense Refill  . traZODone (DESYREL) 150 MG tablet Take 150 mg by mouth at bedtime.    . Buprenorphine HCl-Naloxone HCl (SUBOXONE) 8-2 MG FILM Place 1 Film under the tongue daily.    . diazepam (VALIUM) 5 MG tablet Take one tablet one hour prior to MRI, repeat if needed 2 tablet 0  . diclofenac (VOLTAREN) 50 MG EC tablet Take 1 tablet (50 mg total) by mouth 2 (two) times daily. (Patient not taking: Reported on 02/15/2017) 20 tablet 0  . gabapentin (NEURONTIN) 100 MG capsule Take 100 mg by mouth 3 (three) times daily.    Marland Kitchen  ibuprofen (ADVIL,MOTRIN) 800 MG tablet Take 1 tablet (800 mg total) by mouth 3 (three) times daily. 21 tablet 0  . meloxicam (MOBIC) 15 MG tablet Take 1 tablet (15 mg total) by mouth daily. (Patient not taking: Reported on 02/15/2017) 5 tablet 0  . methocarbamol (ROBAXIN) 500 MG tablet Take 1 tablet (500 mg total) by mouth 2 (two) times daily. (Patient not taking: Reported on 02/15/2017) 20 tablet 0    Musculoskeletal: Strength & Muscle Tone: within normal limits Gait & Station: normal Patient leans: N/A  Psychiatric Specialty Exam: Physical Exam  Constitutional: He is oriented to person, place, and time. He appears well-developed and well-nourished.  HENT:  Head: Normocephalic.  Neck: Normal range of motion.  Respiratory: Effort normal.  Neurological: He is alert and oriented to person, place,  and time.  Psychiatric: His speech is normal and behavior is normal. Cognition and memory are normal. He exhibits a depressed mood. He expresses suicidal ideation. He expresses suicidal plans.    Review of Systems  Psychiatric/Behavioral: Positive for depression, substance abuse and suicidal ideas.  All other systems reviewed and are negative.   Blood pressure 110/67, pulse 62, temperature 98.9 F (37.2 C), temperature source Oral, resp. rate 16, height 6' (1.829 m), weight 63.5 kg (140 lb), SpO2 100 %.Body mass index is 18.99 kg/m.  General Appearance: Disheveled  Eye Contact:  Fair  Speech:  Normal Rate  Volume:  Normal  Mood:  Depressed  Affect:  Congruent  Thought Process:  Coherent  Orientation:  Full (Time, Place, and Person)  Thought Content:  Rumination  Suicidal Thoughts:  Yes.  with intent/plan  Homicidal Thoughts:  No  Memory:  Immediate;   Fair Recent;   Fair Remote;   Fair  Judgement:  Impaired  Insight:  Lacking  Psychomotor Activity:  Decreased  Concentration:  Concentration: Fair and Attention Span: Fair  Recall:  [employer] of Knowledge:  Fair  Language:  Good  Akathisia:  No  Handed:  Right  AIMS (if indicated):     Assets:  Leisure Time Physical Health Resilience Social Support  ADL's:  Intact  Cognition:  WNL  Sleep:        Treatment Plan Summary: Daily contact with patient to assess and evaluate symptoms and progress in treatment, Medication management and Plan major depressive disorder, recurrent, severe without psychosis:  -Crisis stabilization -Medication management:  Started Clonidine protocol and Trazodone 150 mg at bedtime for sleep  -Individual and substance abuse counseling  Disposition: Recommend psychiatric Inpatient admission when medically cleared.  Waylan Boga, NP 04/02/2017 10:53 AM  Patient seen face-to-face for psychiatric evaluation, chart reviewed and case discussed with the physician extender and developed treatment plan.  Reviewed the information documented and agree with the treatment plan. Corena Pilgrim, MD

## 2017-04-03 DIAGNOSIS — F1994 Other psychoactive substance use, unspecified with psychoactive substance-induced mood disorder: Secondary | ICD-10-CM | POA: Diagnosis present

## 2017-04-03 DIAGNOSIS — F332 Major depressive disorder, recurrent severe without psychotic features: Principal | ICD-10-CM

## 2017-04-03 LAB — LIPID PANEL
CHOLESTEROL: 147 mg/dL (ref 0–200)
HDL: 40 mg/dL — AB (ref >40)
LDL CALC: 89 mg/dL (ref 0–99)
TRIGLYCERIDES: 89 mg/dL (ref <150)
Total CHOL/HDL Ratio: 3.7 RATIO
VLDL: 18 mg/dL (ref 0–40)

## 2017-04-03 LAB — TSH: TSH: 0.179 u[IU]/mL — ABNORMAL LOW (ref 0.350–4.500)

## 2017-04-03 NOTE — [facility] Counselor (Signed)
Adult Comprehensive Assessment  Patient ID: Levonne HubertRobbie Nauta, male   DOB: [birth year], 26 y.o.   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  Social relationships: fiance left him due to relapse Substance abuse: relapsed on herion Bereavement / Loss: none reported  Living/Environment/Situation:  Living Arrangements: Parent Living conditions (as described by patient or guardian): Patient lives in home with parents What is atmosphere in current home: Comfortable, Supportive  Family History:  Marital status: Other (comment) (Engaged) Are you sexually active?: Yes What is your sexual orientation?: heterosexual Has your sexual activity been affected by drugs, alcohol, medication, or emotional stress?: no How many children?: 2 How is patient's relationship with their children?: "I want to do better for them."  Childhood History:  By whom was/is the patient raised?: Both parents Additional childhood history information: "Great childhood. I was spoiled. Father was Production designer, theatre/television/filmmanager of [employer], mother owned restaurants. They gave me everything I wanted." Description of patient's relationship with caregiver when they were a child: "Great childhood." Patient's description of current relationship with people who raised him/her: "good." How were you disciplined when you got in trouble as a child/adolescent?: "I got spankings." Does patient have siblings?: Yes Number of Siblings: 1 Description of patient's current relationship with siblings: 28 year old sister- "we don't  talk much" Brother died a 444 y.o. Did patient suffer any verbal/emotional/physical/sexual abuse as a child?: No Did patient suffer from severe childhood neglect?: No Has patient ever been sexually abused/assaulted/raped as an adolescent or adult?: No Was the patient ever a victim of a crime or a disaster?: No Witnessed domestic violence?: No Has patient been effected by domestic violence as an adult?: No  Education:  Highest grade of school patient has completed: Dropped in 9th grade- completed GED in 2016 Currently a student?: No Learning disability?: No  Employment/Work Situation:   Employment situation: Leave of absence Patient's job has been impacted by current illness: No What is the longest time patient has a held a job?: 1 year Where was the patient employed at that time?: Landscaping Has patient ever been in the Eli Lilly and Companymilitary?: No Are There Guns or Other Weapons in Your Home?: Yes Types of Guns/Weapons: Patient reported that father has guns in the home where he resides, they are secured but he can access them for hunting if he asks his father for them. Are These Weapons Safely Secured?: Yes  Financial Resources:   Financial resources: Medicaid, Support from parents / caregiver Does patient have a representative payee or guardian?: No  Alcohol/Substance Abuse:   What has been your use of drugs/alcohol within the last 12 months?: relapsed on heroin on 7/28 after being sober for 10 mos. Stopped using after he had overdose and his 515 yo son saw him. Reports he began using after health issues and using pain medications. He stated he went to pick up heroin and fiance was upset and left him and he used. If attempted suicide, did drugs/alcohol play a role in this?: No (Upon admission patient reported he wrote a note to parents stating he was sorry for using the day prior and that it was  the last time. Patient parents interpreted it as a suicide note and called police.) Alcohol/Substance Abuse  Treatment Hx: Past detox If yes, describe treatment: Patient reported the [facility] Has alcohol/substance abuse ever caused legal problems?: No  Social Support System:   Forensic psychologistatient's Community Support System: Fair Museum/gallery exhibitions officerDescribe Community Support System: support from parents, fiance, kids who are his motivation to get clean again Type of faith/religion: Ephriam KnucklesChristian How does patient's faith help to cope with current illness?: pray  Leisure/Recreation:   Leisure and Hobbies: working on cars, painting, drawing, Engineer, technical salescoloring  Strengths/Needs:   What things does the patient do well?: work on cars In what areas does patient struggle / problems for patient: relapse  Discharge Plan:   Does patient have access to transportation?: Yes Will patient be returning to same living situation after discharge?: Yes Currently receiving community mental health services: Yes (From Whom) [facility]- No follow scheduled at this time.) If no, would patient like referral for services when discharged?: No ([facility]) Does patient have financial barriers related to discharge medications?: No  Summary/Recommendations:   Summary and Recommendations (to be completed by the evaluator): Patient is 26 year old male [facility] to heroin relapse and suicidal ideations. Patient reported on 7/28 he relapsed on herion after being sober for 10 mos. Patient stated that he was upset after fiance left him for seeking heroin and at that time he had not used but when she left him he used. 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 Patient denies it was suicide note. Patient open [facility] and continue with medication management. Recommendations to follow up with medication management, group  therapy, psychoeduational groups, aftercare follow up.   Hessie Dibbleelilah R Emanual [place]ountain. 04/03/2017

## 2017-04-03 NOTE — H&P (Signed)
Psychiatric Admission Assessment Adult  Patient Identification: Peter Wiley MRN:  [id number] Date of Evaluation:  04/03/2017 Chief Complaint:  MDD OPIATE USE DISORDER Principal Diagnosis: Major depressive disorder, recurrent severe without psychotic features (HCC) Diagnosis:   Patient Active Problem List   Diagnosis Date Noted  . Substance induced mood disorder (HCC) [F19.94] 04/03/2017  . Major depressive disorder, recurrent severe without psychotic features (HCC) [F33.2] 04/02/2017  . Opiate dependence, continuous (HCC) [F11.20] 04/02/2017   History of Present Illness: 26 y.o. Male reports that he has had several issues over the last few months that has pushed him back to using drugs. He had to have surgery in June due to blood around his lungs and had a chest tube placed. He had been clean from drugs for 10 months, but he relapsed due to his girlfriends parents harassing him because they do not like him and he gives a very lengthy story about them trying to sabotage their relationship. He had gotten some heroin and his girlfriend new, so she left and her mom came to get their children from him. He then went to the bathroom and used heroin while his children were in the living room. The next day he states that he wrote an apology letter for his parents, but it was thought to be a suicide note by them. He reports that he didn't even give it to them, his cousin found it and gave it to them. He denies any SI/HI/AVH and denies any depression or anxiety. He reports that he was prescribed Zoloft 50 mg PO Daily but he doesn't want to take it because he felt worse while on it. He reports that the amount of Heroin he took was a lot less than he use to take and this was not an overdose attempt, the heroin just hit him harder due to being clean for 10 months. He states that [facility] plans [facility] for therapy or counseling after discharge.  Associated Signs/Symptoms: Depression Symptoms:  he  denies all (Hypo) Manic Symptoms:  Denies all Anxiety Symptoms:  Denies all Psychotic Symptoms:  Denies all PTSD Symptoms: Denies all Total Time spent with patient: 45 minutes  Past Psychiatric History: Hx of substance abuse  Is the patient at risk to self? No.  Has the patient been a risk to self in the past 6 months? No.  Has the patient been a risk to self within the distant past? No.  Is the patient a risk to others? No.  Has the patient been a risk to others in the past 6 months? No.  Has the patient been a risk to others within the distant past? No.   Prior Inpatient Therapy:   Prior Outpatient Therapy:    Alcohol Screening: 1. How often do you have a drink containing alcohol?: Never 9. Have you or someone else been injured as a result of your drinking?: No 10. Has a relative or friend or a doctor or another health worker been concerned about your drinking or suggested you cut down?: No Alcohol Use Disorder Identification Test Final Score (AUDIT): 0 Brief Intervention: AUDIT score less than 7 or less-screening does not suggest unhealthy drinking-brief intervention not indicated Substance Abuse History in the last 12 months:  Yes.   Consequences of Substance Abuse: Medical Consequences:  Reviewed Legal Consequences:  reviewed Family Consequences:  Reviewed Previous Psychotropic Medications: No  Psychological Evaluations: No  Past Medical History:  Past Medical History:  Diagnosis Date  . Acid reflux   .  Drug abuse   . Heart aneurysm   . Heart murmur   . Hepatitis   . Migraines   . Pneumothorax     Past Surgical History:  Procedure Laterality Date  . CARDIAC SURGERY    . CHEST TUBE INSERTION    . WRIST SURGERY     Family History: History reviewed. No pertinent family history. Family Psychiatric  History: None noted Tobacco Screening: Have you used any form of tobacco in the last 30 days? (Cigarettes, Smokeless Tobacco, Cigars, and/or Pipes): Yes Tobacco use,  Select all that apply: 5 or more cigarettes per day Are you interested in Tobacco Cessation Medications?: No, patient refused Counseled patient on smoking cessation including recognizing danger situations, developing coping skills and basic information about quitting provided: Refused/Declined practical counseling Social History:  History  Alcohol Use  . Yes    Comment: occ     History  Drug Use  . Types: Methamphetamines, Marijuana, IV    Comment: heroin    Additional Social History: Marital status: Other (comment) (Engaged) Are you sexually active?: Yes What is your sexual orientation?: heterosexual Has your sexual activity been affected by drugs, alcohol, medication, or emotional stress?: no How many children?: 2 How is patient's relationship with their children?: "I want to do better for them."     Allergies:   Allergies  Allergen Reactions  . Doxycycline Other (See Comments)    "blacks out"  . Keflex [Cephalexin] Nausea And Vomiting  . Neosporin [Neomycin-Bacitracin Zn-Polymyx] Other (See Comments)    REACTION: causes more irritation to skin  . Penicillins     Has patient had a PCN reaction causing immediate rash, facial/tongue/throat swelling, SOB or lightheadedness with hypotension: No Has patient had a PCN reaction causing severe rash involving mucus membranes or skin necrosis: No Has patient had a PCN reaction that required hospitalization: No Has patient had a PCN reaction occurring within the last 10 years: No If all of the above answers are "NO", then may proceed with Cephalosporin use.   . Sulfa Antibiotics Nausea And Vomiting   Lab Results:  Results for orders placed or performed during the hospital encounter of 04/02/17 (from the past 48 hour(s))  Lipid panel     Status: Abnormal   Collection Time: 04/03/17  6:35 AM  Result Value Ref Range   Cholesterol 147 0 - 200 mg/dL   Triglycerides 89 <161 mg/dL   HDL 40 (L) >09 mg/dL   Total CHOL/HDL Ratio 3.7  RATIO   VLDL 18 0 - 40 mg/dL   LDL Cholesterol 89 0 - 99 mg/dL    Comment:        Total Cholesterol/HDL:CHD Risk Coronary Heart Disease Risk Table                     Men   Women  1/2 Average Risk   3.4   3.3  Average Risk       5.0   4.4  2 X Average Risk   9.6   7.1  3 X Average Risk  23.4   11.0        Use the calculated Patient Ratio above and the CHD Risk Table to determine the patient's CHD Risk.        ATP III CLASSIFICATION (LDL):  <100     mg/dL   Optimal  604-540  mg/dL   Near or Above  Optimal  130-159  mg/dL   Borderline  696-295  mg/dL   High  >284     mg/dL   Very High Performed at [facility], [address] [place], [place] [address]   TSH     Status: Abnormal   Collection Time: 04/03/17  6:35 AM  Result Value Ref Range   TSH 0.179 (L) 0.350 - 4.500 uIU/mL    Comment: Performed by a 3rd Generation assay with a functional sensitivity of <=0.01 uIU/mL. Performed at [facility], [address] [place], [place] [address]     Blood Alcohol level:  Lab Results  Component Value Date   ETH <5 04/01/2017   ETH 48 (H) 01/20/2013    Metabolic Disorder Labs:  No results found for: HGBA1C, MPG No results found for: PROLACTIN Lab Results  Component Value Date   CHOL 147 04/03/2017   TRIG 89 04/03/2017   HDL 40 (L) 04/03/2017   CHOLHDL 3.7 04/03/2017   VLDL 18 04/03/2017   LDLCALC 89 04/03/2017    Current Medications: Current Facility-Administered Medications  Medication Dose Route Frequency Provider Last Rate Last Dose  . acetaminophen (TYLENOL) tablet 650 mg  650 mg Oral Q6H PRN Charm Rings, NP      . alum & mag hydroxide-simeth (MAALOX/MYLANTA) 200-200-20 MG/5ML suspension 30 mL  30 mL Oral Q4H PRN Charm Rings, NP      . cloNIDine (CATAPRES) tablet 0.1 mg  0.1 mg Oral QID Charm Rings, NP   0.1 mg at 04/03/17 1205   Followed by  . [START ON 04/04/2017] cloNIDine (CATAPRES) tablet 0.1 mg  0.1 mg  Oral BID Charm Rings, NP       Followed by  . [START ON 04/06/2017] cloNIDine (CATAPRES) tablet 0.1 mg  0.1 mg Oral Daily Lord, Jamison Y, NP      . dicyclomine (BENTYL) tablet 20 mg  20 mg Oral Q6H PRN Charm Rings, NP      . hydrOXYzine (ATARAX/VISTARIL) tablet 25 mg  25 mg Oral Q6H PRN Charm Rings, NP   25 mg at 04/02/17 2143  . loperamide (IMODIUM) capsule 2-4 mg  2-4 mg Oral PRN Charm Rings, NP      . magnesium hydroxide (MILK OF MAGNESIA) suspension 30 mL  30 mL Oral Daily PRN Charm Rings, NP      . methocarbamol (ROBAXIN) tablet 500 mg  500 mg Oral Q8H PRN Charm Rings, NP      . naproxen (NAPROSYN) tablet 500 mg  500 mg Oral BID PRN Charm Rings, NP      . nicotine polacrilex (NICORETTE) gum 2 mg  2 mg Oral PRN Armandina Stammer I, NP      . ondansetron (ZOFRAN-ODT) disintegrating tablet 4 mg  4 mg Oral Q6H PRN Charm Rings, NP      . traZODone (DESYREL) tablet 150 mg  150 mg Oral QHS Charm Rings, NP   150 mg at 04/02/17 2144   PTA Medications: Prescriptions Prior to Admission  Medication Sig Dispense Refill Last Dose  . buprenorphine-naloxone (SUBOXONE) 8-2 mg SUBL SL tablet Place 0.5 tablets under the tongue daily.   03/31/2017  . methocarbamol (ROBAXIN) 750 MG tablet Take 750 mg by mouth every 6 (six) hours as needed for muscle spasms.   03/31/2017  . sertraline (ZOLOFT) 50 MG tablet Take 50 mg by mouth daily.   04/01/2017 at Unknown time  . tamsulosin (  FLOMAX) 0.4 MG CAPS capsule Take 0.4 mg by mouth daily.   Past Week at Unknown time  . traMADol (ULTRAM) 50 MG tablet Take 50 mg by mouth every 6 (six) hours as needed for moderate pain.   03/31/2017  . ibuprofen (ADVIL,MOTRIN) 800 MG tablet Take 1 tablet (800 mg total) by mouth 3 (three) times daily. (Patient not taking: Reported on 04/02/2017) 21 tablet 0 Not Taking at Unknown time  . meloxicam (MOBIC) 15 MG tablet Take 1 tablet (15 mg total) by mouth daily. (Patient not taking: Reported on 02/15/2017) 5 tablet 0  Not Taking at Unknown time  . methocarbamol (ROBAXIN) 500 MG tablet Take 1 tablet (500 mg total) by mouth 2 (two) times daily. (Patient not taking: Reported on 02/15/2017) 20 tablet 0 Not Taking at Unknown time    Musculoskeletal: Strength & Muscle Tone: within normal limits Gait & Station: normal Patient leans: N/A  Psychiatric Specialty Exam: Physical Exam  Nursing note and vitals reviewed. Constitutional: He is oriented to person, place, and time. He appears well-developed and well-nourished.  Musculoskeletal: Normal range of motion.  Neurological: He is alert and oriented to person, place, and time.    Review of Systems  Constitutional: Negative.   HENT: Negative.   Eyes: Negative.   Respiratory: Negative.   Cardiovascular: Negative.   Gastrointestinal: Negative.   Genitourinary: Negative.   Musculoskeletal: Negative.   Skin:       Multiple sores all over body  Neurological: Negative.   Endo/Heme/Allergies: Negative.     Blood pressure (!) 96/59, pulse (!) 115, temperature 98.2 F (36.8 C), temperature source Oral, resp. rate 16, height 5\' 9"  (1.753 m), weight 59.4 kg (131 lb).Body mass index is 19.35 kg/m.  General Appearance: Casual  Eye Contact:  Good  Speech:  Clear and Coherent  Volume:  Normal  Mood:  Euthymic  Affect:  Appropriate  Thought Process:  Coherent and Descriptions of Associations: Intact  Orientation:  Full (Time, Place, and Person)  Thought Content:  WDL  Suicidal Thoughts:  No  Homicidal Thoughts:  No  Memory:  Immediate;   Good Recent;   Good  Judgement:  Fair  Insight:  Fair  Psychomotor Activity:  Normal  Concentration:  Concentration: Good and Attention Span: Good  Recall:  Good  Fund of Knowledge:  Good  Language:  Good  Akathisia:  No  Handed:  Right  AIMS (if indicated):     Assets:  Housing Social Support  ADL's:  Intact  Cognition:  WNL  Sleep:  Number of Hours: 6.75    Treatment Plan Summary: Daily contact with patient  to assess and evaluate symptoms and progress in treatment, Medication management and Plan is to:  -Encourage group therapy participation -Monitor for withdrawal symptoms  Observation Level/Precautions:  15 minute checks  Laboratory:  CBC Chemistry Profile UDS UA  Psychotherapy:  Group therapy  Medications:  See MAR  Consultations:  As needed  Discharge Concerns:  Relapse  Estimated LOS: 3-5 days  Other:  Admit to [address]   Physician Treatment Plan for Primary Diagnosis: Major depressive disorder, recurrent severe without psychotic features (HCC) Long Term Goal(s): Improvement in symptoms so as ready for discharge  Short Term Goals: Ability to disclose and discuss suicidal ideas  Physician Treatment Plan for Secondary Diagnosis: Principal Problem:   Major depressive disorder, recurrent severe without psychotic features (HCC) Active Problems:   Opiate dependence, continuous (HCC)   Substance induced mood disorder (HCC)  Long Term Goal(s): Improvement  in symptoms so as ready for discharge  Short Term Goals: Ability to identify changes in lifestyle to reduce recurrence of condition will improve, Ability to verbalize feelings will improve and Ability to demonstrate self-control will improve  I certify that inpatient services furnished can reasonably be expected to improve the patient's condition.    Maryfrances Bunnellravis B Felise Georgia, FNP 7/31/20181:56 PM

## 2017-04-03 NOTE — Progress Notes (Signed)
Pt was observed at the beginning of the shift sitting in the dayroom watching TV.  He reports that he is here by mistake and that his mother thought he was suicidal because he left a note telling his family that he had relapsed on heroin.  He says he was never suicidal, but is upset with himself that he used again after being clean for months.  He denies SI/HI/AVH.  He voices no needs or concerns at this time.  Writer reviewed pt's plan of care and the meds that are available to him this evening.  Pt voices understanding.  He was pleasant and cooperative with staff.  Support and encouragement offered.  Pt was encouraged to make his needs known to staff.  Discharge plans are in process.  Safety maintained with q15 minute checks.

## 2017-04-03 NOTE — [facility] Suicide Risk Assessment (Signed)
[facility]   Nursing information obtained from:  Patient Demographic factors:  Male, Caucasian, Unemployed, Access to firearms, NA Current Mental Status:  Suicidal ideation indicated by others Loss Factors:  NA Historical Factors:  Family history of mental illness or substance abuse Risk Reduction Factors:  Responsible for children under 28 years of age, Living with another person, especially a relative, Positive social support  Total Time spent with patient: 45 minutes Principal Problem: Major depressive disorder, recurrent severe without psychotic features (HCC) Diagnosis:   Patient Active Problem List   Diagnosis Date Noted  . Substance induced mood disorder (HCC) [F19.94] 04/03/2017  . Major depressive disorder, recurrent severe without psychotic features (HCC) [F33.2] 04/02/2017  . Opiate dependence, continuous (HCC) [F11.20] 04/02/2017   Subjective Data:  26 yo Caucasian male, single, lives with his family. Background history of SUD. Involuntarily committed by his family on account of suicidal thoughts. Left text messages and notes for his family before he went missing. UDS was positive for THC and amphetamines.  Patient attempted to hang himself a couple of months ago. He dropped his weight and was cut loose by his father. Patient is minimizing recent suicidal thoughts. Says his note was misconstrued as [place] to move to [place]. No associated psychotic features. No associated depression. Says he has not felt as happy as he does feel now. No past benefit from psychotropic medications. He does not to be prescribed any psychotropic medications. Focused on getting Suboxone. No thoughts of violence. No homicidal thoughts. Denies any new stressors. Very focused on being discharged.  Continued Clinical Symptoms:  Alcohol Use Disorder Identification Test Final Score (AUDIT): 0 The "Alcohol Use Disorders Identification Test", Guidelines for Use in Primary Care,  Second Edition.  World Science writerHealth Organization [facility](WHO). Score between 0-7:  no or low risk or alcohol related problems. Score between 8-15:  moderate risk of alcohol related problems. Score between 16-19:  high risk of alcohol related problems. Score 20 or above:  warrants further diagnostic evaluation for alcohol dependence and treatment.   CLINICAL FACTORS:  Substance Use Disorder Substance Induced Mood disorder   Musculoskeletal: Strength & Muscle Tone: within normal limits Gait & Station: normal Patient leans: N/A  Psychiatric Specialty Exam: Physical Exam  Constitutional: He is oriented to person, place, and time. No distress.  HENT:  Head: Normocephalic and atraumatic.  Cardiovascular: Normal rate.   Respiratory: Effort normal.  Neurological: He is alert and oriented to person, place, and time.  Skin: He is not diaphoretic.  Rashes all over his extremeties  Psychiatric:  As above    ROS  Blood pressure (!) 96/59, pulse 100, temperature 98.2 F (36.8 C), temperature source Oral, resp. rate 16, height 5\' 9"  (1.753 m), weight 59.4 kg (131 lb).Body mass index is 19.35 kg/m.  General Appearance: Casually dressed. Rashes in his extremities. Moderate rapport. Not internally distracted.   Eye Contact:  Good  Speech:  Clear and Coherent and Normal Rate  Volume:  Normal  Mood:  Irritable  Affect:  Congruent  Thought Process:  Linear  Orientation:  Full (Time, Place, and Person)  Thought Content:  Minimization and denial. No hallucination in any modality.   Suicidal Thoughts:  Yes.  with intent/plan though patient is denying this  Homicidal Thoughts:  No  Memory:  Immediate;   Fair Recent;   Fair Remote;   Fair  Judgement:  Fair  Insight:  Shallow  Psychomotor Activity:  Normal  Concentration:  Concentration: Fair and Attention  Span: Fair  Recall:  [employer]  Fund of Knowledge:  Fair  Language:  Good  Akathisia:  Negative  Handed:    AIMS (if indicated):     Assets:   Housing Physical Health  ADL's:  Intact  Cognition:  WNL  Sleep:  Number of Hours: 6.75      COGNITIVE FEATURES THAT CONTRIBUTE TO RISK:  Closed-mindedness    SUICIDE RISK:   Severe:  Frequent, intense, and enduring suicidal ideation, specific plan, no subjective intent, but some objective markers of intent (i.e., choice of lethal method), the method is accessible, some limited preparatory behavior, evidence of impaired self-control, severe dysphoria/symptomatology, multiple risk factors present, and few if any protective factors, particularly a lack of social support.  PLAN OF CARE:  1. Symptomatic withdrawal protocol 2. Suicide precautions 3. Collateral from his family 4. SW would obtain copies of his suicide note and text messages   I certify that inpatient services furnished can reasonably be expected to improve the patient's condition.   Georgiann CockerVincent A Saidy Ormand, MD 04/03/2017, 4:00 PM

## 2017-04-03 NOTE — Progress Notes (Signed)
DAR NOTE: Patient presents with calm affect and mood appropriate to situation. Denies pain, auditory and visual hallucinations.  Patient states he would like to discuss with the doctor going back on Subaxone.  Described energy level as low and concentration as poor.  Reports withdrawal symptoms of tremors, cravings, agitation, and chilling on self inventory form.  Rates depression at 7, hopelessness at 5, and anxiety at 5.  Maintained on routine safety checks.  Medications given as prescribed.  Support and encouragement offered as needed.  States goal for today is "going home."  Patient was visible in milieu for activity.

## 2017-04-03 NOTE — Plan of Care (Signed)
Problem: Safety: Goal: Periods of time without injury will increase Outcome: Progressing Patient is free from injury.  Routine safety checks maintained.   

## 2017-04-03 NOTE — Tx Team (Signed)
Interdisciplinary Treatment and Diagnostic Plan Update  04/03/2017 Time of Session: [place] MRN: [id number]  Principal Diagnosis: MDD recurrent, severe  Secondary Diagnoses: Active Problems:   Major depressive disorder, recurrent severe without psychotic features ([place])   Current Medications:  Current Facility-Administered Medications  Medication Dose Route Frequency Provider Last Rate Last Dose  . acetaminophen (TYLENOL) tablet 650 mg  650 mg Oral Q6H PRN Patrecia Pour, NP      . alum & mag hydroxide-simeth (MAALOX/MYLANTA) 200-200-20 MG/5ML suspension 30 mL  30 mL Oral Q4H PRN Patrecia Pour, NP      . cloNIDine (CATAPRES) tablet 0.1 mg  0.1 mg Oral QID Patrecia Pour, NP   0.1 mg at 04/02/17 2143   Followed by  . [START ON 04/04/2017] cloNIDine (CATAPRES) tablet 0.1 mg  0.1 mg Oral BID Patrecia Pour, NP       Followed by  . [START ON 04/06/2017] cloNIDine (CATAPRES) tablet 0.1 mg  0.1 mg Oral Daily Lord, Jamison Y, NP      . dicyclomine (BENTYL) tablet 20 mg  20 mg Oral Q6H PRN Patrecia Pour, NP      . hydrOXYzine (ATARAX/VISTARIL) tablet 25 mg  25 mg Oral Q6H PRN Patrecia Pour, NP   25 mg at 04/02/17 2143  . loperamide (IMODIUM) capsule 2-4 mg  2-4 mg Oral PRN Patrecia Pour, NP      . magnesium hydroxide (MILK OF MAGNESIA) suspension 30 mL  30 mL Oral Daily PRN Patrecia Pour, NP      . methocarbamol (ROBAXIN) tablet 500 mg  500 mg Oral Q8H PRN Patrecia Pour, NP      . naproxen (NAPROSYN) tablet 500 mg  500 mg Oral BID PRN Patrecia Pour, NP      . nicotine polacrilex (NICORETTE) gum 2 mg  2 mg Oral PRN Lindell Spar I, NP      . ondansetron (ZOFRAN-ODT) disintegrating tablet 4 mg  4 mg Oral Q6H PRN Patrecia Pour, NP      . traZODone (DESYREL) tablet 150 mg  150 mg Oral QHS Patrecia Pour, NP   150 mg at 04/02/17 2144   PTA Medications: Prescriptions Prior to Admission  Medication Sig Dispense Refill Last Dose  . buprenorphine-naloxone (SUBOXONE) 8-2 mg  SUBL SL tablet Place 0.5 tablets under the tongue daily.   03/31/2017  . methocarbamol (ROBAXIN) 750 MG tablet Take 750 mg by mouth every 6 (six) hours as needed for muscle spasms.   03/31/2017  . sertraline (ZOLOFT) 50 MG tablet Take 50 mg by mouth daily.   04/01/2017 at Unknown time  . tamsulosin (FLOMAX) 0.4 MG CAPS capsule Take 0.4 mg by mouth daily.   Past Week at Unknown time  . traMADol (ULTRAM) 50 MG tablet Take 50 mg by mouth every 6 (six) hours as needed for moderate pain.   03/31/2017  . ibuprofen (ADVIL,MOTRIN) 800 MG tablet Take 1 tablet (800 mg total) by mouth 3 (three) times daily. (Patient not taking: Reported on 04/02/2017) 21 tablet 0 Not Taking at Unknown time  . meloxicam (MOBIC) 15 MG tablet Take 1 tablet (15 mg total) by mouth daily. (Patient not taking: Reported on 02/15/2017) 5 tablet 0 Not Taking at Unknown time  . methocarbamol (ROBAXIN) 500 MG tablet Take 1 tablet (500 mg total) by mouth 2 (two) times daily. (Patient not taking: Reported on 02/15/2017) 20 tablet 0 Not Taking at Unknown time    Patient  Stressors: Substance abuse  Patient Strengths: Average or above average Architect for treatment/growth Supportive family/friends  Treatment Modalities: Medication Management, Group therapy, Case management,  1 to 1 session with clinician, Psychoeducation, Recreational therapy.   Physician Treatment Plan for Primary Diagnosis: MDD recurrent, severe  Medication Management: Evaluate patient's response, side effects, and tolerance of medication regimen.  Therapeutic Interventions: 1 to 1 sessions, Unit Group sessions and Medication administration.  Evaluation of Outcomes: Not Met  Physician Treatment Plan for Secondary Diagnosis: Active Problems:   Major depressive disorder, recurrent severe without psychotic features ([place])   Medication Management: Evaluate patient's response, side effects, and tolerance of medication  regimen.  Therapeutic Interventions: 1 to 1 sessions, Unit Group sessions and Medication administration.  Evaluation of Outcomes: Not Met   RN Treatment Plan for Primary Diagnosis: MDD recurrent, severe Long Term Goal(s): Knowledge of disease and therapeutic regimen to maintain health will improve  Short Term Goals: Ability to remain free from injury will improve, Ability to verbalize feelings will improve and Ability to disclose and discuss suicidal ideas  Medication Management: RN will administer medications as ordered by provider, will assess and evaluate patient's response and provide education to patient for prescribed medication. RN will report any adverse and/or side effects to prescribing provider.  Therapeutic Interventions: 1 on 1 counseling sessions, Psychoeducation, Medication administration, Evaluate responses to treatment, Monitor vital signs and CBGs as ordered, Perform/monitor CIWA, COWS, AIMS and Fall Risk screenings as ordered, Perform wound care treatments as ordered.  Evaluation of Outcomes: Not Met   LCSW Treatment Plan for Primary Diagnosis: MDD recurrent, severe Long Term Goal(s): Safe transition to appropriate next level of care at discharge, Engage patient in therapeutic group addressing interpersonal concerns.  Short Term Goals: Engage patient in aftercare planning with referrals and resources, Facilitate patient progression through stages of change regarding substance use diagnoses and concerns and Identify triggers associated with mental health/substance abuse issues  Therapeutic Interventions: Assess for all discharge needs, 1 to 1 time with Social worker, Explore available resources and support systems, Assess for adequacy in community support network, Educate family and significant other(s) on suicide prevention, Complete Psychosocial Assessment, Interpersonal group therapy.  Evaluation of Outcomes: Not Met   Progress in Treatment: Attending groups:  No. Participating in groups: No.New to unit. Continuing to assess.  Taking medication as prescribed: Yes. Toleration medication: Yes. Family/Significant other contact made: No, will contact:  family member if patient consents. Patient understands diagnosis: Yes. Discussing patient identified problems/goals with staff: Yes. Medical problems stabilized or resolved: Yes. Denies suicidal/homicidal ideation: Yes. Issues/concerns per patient self-inventory: No. Other: n/a   New problem(s) identified: No, Describe:  n/a  New Short Term/Long Term Goal(s): detox, medication management for mood stabilization, development of comprehensive mental wellness/sobriety plan.   Discharge Plan or Barriers: CSW assessing for appropriate referrals. Pt has no current mental health providers.   Reason for Continuation of Hospitalization: Anxiety Depression Medication stabilization Suicidal ideation Withdrawal symptoms  Estimated Length of Stay: Friday, 04/06/17  Attendees: Patient: 04/03/2017 9:26 AM  Physician: Dr. Sanjuana Letters MD; Dr. Parke Poisson MD; Dr. Dwyane Dee MD 04/03/2017 9:26 AM  Nursing: Danae Chen RN 04/03/2017 9:26 AM  RN Care Manager: Lars Pinks CM 04/03/2017 9:26 AM  Social Worker: Maxie Better, LCSW 04/03/2017 9:26 AM  Recreational Therapist: x 04/03/2017 9:26 AM  Other: Lindell Spar NP; Marvia Pickles NP; Ricky Ala NP 04/03/2017 9:26 AM  Other:  04/03/2017 9:26 AM  Other: 04/03/2017 9:26 AM    Scribe for Treatment  Team: Jerome, LCSW 04/03/2017 9:26 AM

## 2017-04-03 NOTE — [facility] Suicide Risk Assessment (Addendum)
[facility] INPATIENT:  Family/Significant Other Suicide Prevention Education  Suicide Prevention Education:  Education Completed via phone with mother Peter Wiley [phone]) who has been identified by the patient as the family member/significant other with whom the patient will be residing, and identified as the person(s) who will aid the patient in the event of a mental health crisis (suicidal ideations/suicide attempt).  With written consent from the patient, the family member/significant other has been provided the following suicide prevention education, prior to the and/or following the discharge of the patient.  The suicide prevention education provided includes the following:  Suicide risk factors  Suicide prevention and interventions  National Suicide Hotline telephone number  [facility] assessment telephone number  [facility] Emergency Assistance 911  [facility] and/or Residential Mobile Crisis Unit telephone number  Request made of family/significant other to:  Remove weapons (e.g., guns, rifles, knives), all items previously/currently identified as safety concern.    Remove drugs/medications (over-the-counter, prescriptions, illicit drugs), all items previously/currently identified as a safety concern.  The family member/significant other verbalizes understanding of the suicide prevention education information provided.  The family member/significant other agrees to remove the items of safety concern listed above.  CSW prompted mother about bringing letter he wrote that family interpreted as a suicide note. Mother stated that she gave the letter to police officer who took him to the ED.   Mother reported that there were guns in the home that were locked and secured and patient does not have access.   Peter Wiley 04/03/2017, 3:53 PM

## 2017-04-04 ENCOUNTER — Encounter (HOSPITAL_COMMUNITY): Payer: Self-pay | Admitting: Behavioral Health

## 2017-04-04 DIAGNOSIS — F17210 Nicotine dependence, cigarettes, uncomplicated: Secondary | ICD-10-CM

## 2017-04-04 DIAGNOSIS — F1120 Opioid dependence, uncomplicated: Secondary | ICD-10-CM

## 2017-04-04 DIAGNOSIS — F1290 Cannabis use, unspecified, uncomplicated: Secondary | ICD-10-CM

## 2017-04-04 DIAGNOSIS — F419 Anxiety disorder, unspecified: Secondary | ICD-10-CM

## 2017-04-04 DIAGNOSIS — F1994 Other psychoactive substance use, unspecified with psychoactive substance-induced mood disorder: Secondary | ICD-10-CM

## 2017-04-04 LAB — T4, FREE: Free T4: 0.64 ng/dL (ref 0.61–1.12)

## 2017-04-04 LAB — HEMOGLOBIN A1C
HEMOGLOBIN A1C: 4.7 % — AB (ref 4.8–5.6)
MEAN PLASMA GLUCOSE: 88 mg/dL

## 2017-04-04 NOTE — Progress Notes (Signed)
Recreation Therapy Notes  Date: 04/04/2017 Time: 9:30am Location: 300 Hall Dayroom  Group Topic: Stress Management  Goal Area(s) Addresses:  Patient will verbalize importance of using healthy stress management.  Patient will identify positive emotions associated with healthy stress management.   Behavioral Response: Engaged  Intervention: Stress Management  Activity : Visual Relaxation. Recreation Therapy Intern introduced the stress management technique of visual relaxation. Recreation Therapy Intern read a script that allowed patients to take mental trip to the beach. Recreation Therapy Intern played calming beach wave sounds. Patients were to follow along as script was read to engage in the activity.  Education: Stress Management, Discharge Planning.   Education Outcome: Acknowledges edcuation  Clinical Observations/Feedback: Pt attended group.  Peter Wiley, Recreation Therapy Intern  Peter Wiley, LRT/CTRS 

## 2017-04-04 NOTE — Progress Notes (Signed)
Pt attended NA meeting this evening.  

## 2017-04-04 NOTE — Progress Notes (Signed)
[facility]  04/04/2017 10:17 AM Peter Wiley  MRN:  [id number]  Subjective: " I am doing alright. Just making the most out of being here."  Evaluation on the unit: Face to face evaluation completed, case discussed during treatment team, chart reviewed.   Peter Wiley is a 26 year old [facility] with a history of substance abuse and with a UDS positive for amphetamines and THC. Per notes, patient attempted to hang himself a couple of months ago. During this evaluation, patient is alert and oriented x4, calm, and cooperative. He denies any SI with plan or intent, homicidal ideas or urges to self-harm. He denies AVH and does not appear to be internally preoccupied. He denies depressive symptoms as well as feelings of hopelessness. Patient appears to be minimizing the severity of his mental status. He does endorse anxiety and rates anxiety as 7/10 with 0 being none and 10 being the worse and only endorses that his anxiety is elevated because he wants to go home. As per nursing,Pt presents with anxious affect and mood.  Describes his day as "okay, I guess."  Pt states "I'm ready to go home, I don't belong here."  Patients insight is shallow. He reports medications are well tolerated and without side effects. At this time, he is able to contract for safety on the unit.    Principal Problem: Major depressive disorder, recurrent severe without psychotic features (H[facility]) Diagnosis:   Patient Active Problem List   Diagnosis Date Noted  . Substance induced mood disorder (HCC) [F19.94] 04/03/2017  . Major depressive disorder, recurrent severe without psychotic features ([facility]) [F33.2] 04/02/2017  . Opiate dependence, continuous ([facility]) [F11.20] 04/02/2017   Total Time spent with patient: 20 minutes  Past Psychiatric History: Hx of substance abuse  Past Medical History:  Past Medical History:  Diagnosis Date  . Acid reflux   . Drug abuse   . Heart aneurysm   .  Heart murmur   . Hepatitis   . Migraines   . Pneumothorax     Past Surgical History:  Procedure Laterality Date  . CARDIAC SURGERY    . CHEST TUBE INSERTION    . WRIST SURGERY     Family History: History reviewed. No pertinent family history. Family Psychiatric  History: None noted Social History:  History  Alcohol Use  . Yes    Comment: occ     History  Drug Use  . Types: Methamphetamines, Marijuana, IV    Comment: heroin    Social History   Social History  . Marital status: Single    Spouse name: N/A  . Number of children: N/A  . Years of education: N/A   Social History Main Topics  . Smoking status: Current Every Day Smoker    Packs/day: 1.00    Types: Cigarettes  . Smokeless tobacco: Never Used  . Alcohol use Yes     Comment: occ  . Drug use: Yes    Types: Methamphetamines, Marijuana, IV     Comment: heroin  . Sexual activity: Yes   Other Topics Concern  . None   Social History Narrative  . None   Additional Social History:       Sleep: Fair  Appetite:  Fair  Current Medications: Current Facility-Administered Medications  Medication Dose Route Frequency Provider Last Rate Last Dose  . acetaminophen (TYLENOL) tablet 650 mg  650 mg Oral Q6H PRN Charm RingsLord, Jamison Y, NP      .  alum & mag hydroxide-simeth (MAALOX/MYLANTA) 200-200-20 MG/5ML suspension 30 mL  30 mL Oral Q4H PRN Charm Rings, NP      . cloNIDine (CATAPRES) tablet 0.1 mg  0.1 mg Oral BID Charm Rings, NP   0.1 mg at 04/04/17 0848   Followed by  . [START ON 04/06/2017] cloNIDine (CATAPRES) tablet 0.1 mg  0.1 mg Oral Daily Lord, Jamison Y, NP      . dicyclomine (BENTYL) tablet 20 mg  20 mg Oral Q6H PRN Charm Rings, NP      . hydrOXYzine (ATARAX/VISTARIL) tablet 25 mg  25 mg Oral Q6H PRN Charm Rings, NP   25 mg at 04/03/17 2315  . loperamide (IMODIUM) capsule 2-4 mg  2-4 mg Oral PRN Charm Rings, NP      . magnesium hydroxide (MILK OF MAGNESIA) suspension 30 mL  30 mL Oral Daily  PRN Charm Rings, NP      . methocarbamol (ROBAXIN) tablet 500 mg  500 mg Oral Q8H PRN Charm Rings, NP   500 mg at 04/04/17 0849  . naproxen (NAPROSYN) tablet 500 mg  500 mg Oral BID PRN Charm Rings, NP   500 mg at 04/03/17 2119  . nicotine polacrilex (NICORETTE) gum 2 mg  2 mg Oral PRN Armandina Stammer I, NP   2 mg at 04/04/17 0849  . ondansetron (ZOFRAN-ODT) disintegrating tablet 4 mg  4 mg Oral Q6H PRN Charm Rings, NP      . traZODone (DESYREL) tablet 150 mg  150 mg Oral QHS Charm Rings, NP   150 mg at 04/03/17 2118    Lab Results:  Results for orders placed or performed during the hospital encounter of 04/02/17 (from the past 48 hour(s))  Hemoglobin A1c     Status: Abnormal   Collection Time: 04/03/17  6:35 AM  Result Value Ref Range   Hgb A1c MFr Bld 4.7 (L) 4.8 - 5.6 %    Comment: (NOTE)         Pre-diabetes: 5.7 - 6.4         Diabetes: >6.4         Glycemic control for adults with diabetes: <7.0    Mean Plasma Glucose 88 mg/dL    Comment: (NOTE) Performed At: [facility] [address] [place], [place] [id number] Mila [place] MD [id number] Performed at [facility], [address] [place], [place] [address]   Lipid panel     Status: Abnormal   Collection Time: 04/03/17  6:35 AM  Result Value Ref Range   Cholesterol 147 0 - 200 mg/dL   Triglycerides 89 <132 mg/dL   HDL 40 (L) >44 mg/dL   Total CHOL/HDL Ratio 3.7 RATIO   VLDL 18 0 - 40 mg/dL   LDL Cholesterol 89 0 - 99 mg/dL    Comment:        Total Cholesterol/HDL:CHD Risk Coronary Heart Disease Risk Table                     Men   Women  1/2 Average Risk   3.4   3.3  Average Risk       5.0   4.4  2 X Average Risk   9.6   7.1  3 X Average Risk  23.4   11.0        Use the calculated Patient Ratio above and the CHD Risk Table to determine the patient's CHD  Risk.        ATP III CLASSIFICATION (LDL):  <100     mg/dL   Optimal  161-096  mg/dL   Near or Above                     Optimal  130-159  mg/dL   Borderline  045-409  mg/dL   High  >811     mg/dL   Very High Performed at [facility], [address], [place], [place] [address]   TSH     Status: Abnormal   Collection Time: 04/03/17  6:35 AM  Result Value Ref Range   TSH 0.179 (L) 0.350 - 4.500 uIU/mL    Comment: Performed by a 3rd Generation assay with a functional sensitivity of <=0.01 uIU/mL. Performed at [facility], [address] [place], [place] [address]     Blood Alcohol level:  Lab Results  Component Value Date   ETH <5 04/01/2017   ETH 48 (H) 01/20/2013    Metabolic Disorder Labs: Lab Results  Component Value Date   HGBA1C 4.7 (L) 04/03/2017   MPG 88 04/03/2017   No results found for: PROLACTIN Lab Results  Component Value Date   CHOL 147 04/03/2017   TRIG 89 04/03/2017   HDL 40 (L) 04/03/2017   CHOLHDL 3.7 04/03/2017   VLDL 18 04/03/2017   LDLCALC 89 04/03/2017    Physical Findings: AIMS: Facial and Oral Movements Muscles of Facial Expression: None, normal Lips and Perioral Area: None, normal Jaw: None, normal Tongue: None, normal,Extremity Movements Upper (arms, wrists, hands, fingers): None, normal Lower (legs, knees, ankles, toes): None, normal, Trunk Movements Neck, shoulders, hips: None, normal, Overall Severity Severity of abnormal movements (highest score from questions above): None, normal Incapacitation due to abnormal movements: None, normal Patient's awareness of abnormal movements (rate only patient's report): No Awareness, Dental Status Current problems with teeth and/or dentures?: No Does patient usually wear dentures?: No  CIWA:    COWS:  COWS Total Score: 5  Musculoskeletal: Strength & Muscle Tone: within normal limits Gait & Station: normal Patient leans: N/A  Psychiatric Specialty Exam: Physical Exam  Nursing note and vitals reviewed. Constitutional: He is oriented to person, place, and time.   Neurological: He is alert and oriented to person, place, and time.    Review of Systems  Psychiatric/Behavioral: Positive for depression and substance abuse. Negative for hallucinations, memory loss and suicidal ideas. The patient is nervous/anxious. The patient does not have insomnia.   All other systems reviewed and are negative.   Blood pressure 102/66, pulse (!) 103, temperature 98.1 F (36.7 C), temperature source Oral, resp. rate 16, height 5\' 9"  (1.753 m), weight 131 lb (59.4 kg).Body mass index is 19.35 kg/m.  General Appearance: Casual  Eye Contact:  Good  Speech:  Clear and Coherent and Normal Rate  Volume:  Normal  Mood:  Anxious  Affect:  Appropriate  Thought Process:  Coherent, Linear and Descriptions of Associations: Intact  Orientation:  Full (Time, Place, and Person)  Thought Content:  minimizing current mental health state   Suicidal Thoughts:  No  Homicidal Thoughts:  No  Memory:  Immediate;   Fair Recent;   Fair  Judgement:  Impaired  Insight:  Shallow  Psychomotor Activity:  Normal  Concentration:  Concentration: Fair and Attention Span: Fair  Recall:  [employer] of Knowledge:  Fair  Language:  Good  Akathisia:  Negative  Handed:  Right  AIMS (if indicated):     Assets:  Desire for Improvement Resilience  ADL's:  Intact  Cognition:  WNL  Sleep:  Number of Hours: 6     Treatment Plan Summary: Daily contact with patient to assess and evaluate symptoms and progress in treatment   Medication management: Psychiatric conditions are unstable at this time. He continues to minimize current mental health state. All symptoms are denies as noted above besides anxiety which he endorses anxiety ios only related to him wanting to be discharged.  To reduce current symptoms to base line and improve the patient's overall level of functioning will continue the following treatment plan without adjustments. No  psychotropic medications prescribed at this time;   Continue  Clonidine detox protocol for withdrawal symptoms  Continue vistaril 25 mg po q6hrs as needed for anxiety  Continue Trazodone 150 mg po daily at bedtime for sleep  Continue nicorette gum 2 mg as needed for smoking cessation   Other:  Safety: Will  Continue 15 minute observation for safety checks. Patient is able to contract for safety on the unit at this time  Labs: TSH 0.179. Ordered free t4 and free t3  Continue to develop treatment plan to decrease risk of relapse upon discharge and to reduce the need for readmission.  Psycho-social education regarding relapse prevention and self care.  Health care follow up as needed for medical problems.  Continue to attend and participate in therapy.     Denzil MagnusonLaShunda Adolphe Fortunato, NP 04/04/2017, 10:17 AM

## 2017-04-04 NOTE — Progress Notes (Signed)
Adult Psychoeducational Group Note  Date:  04/04/2017 Time:  12:28 AM  Group Topic/Focus:  Wrap-Up Group:   The focus of this group is to help patients review their daily goal of treatment and discuss progress on daily workbooks.  Participation Level:  Active  Participation Quality:  Appropriate  Affect:  Appropriate  Cognitive:  Appropriate  Insight: Appropriate  Engagement in Group:  Engaged  Modes of Intervention:  Discussion  Additional Comments:  Pt stated his goal for today was to talk with his doctor about his medication. Pt stated he felt good after he accomplished his goal. Pt rated his overall day a 7 out of 10. Pt stated tomorrow goal was to attend all groups.  Felipa FurnaceChristopher  Kieu Wiley 04/04/2017, 12:28 AM

## 2017-04-04 NOTE — Progress Notes (Signed)
Patient ID: Peter Wiley, male   DOB: May 22, 1991, 26 y.o.   MRN: [id number]  DAR: Pt. Denies SI/HI and A/V Hallucinations. He reports sleep is poor, appetite is good, energy level is low, and concentration is good. He rates depression, hopelessness, and anxiety 1/10. Patient does report pain, anxiety and nicotine craving which he is receiving PRN medications for. Support and encouragement provided to the patient. Scheduled medications administered to patient per physician's orders. Patient is seen in the milieu and is interacting with some of his peers. Q15 minute checks are maintained for safety.

## 2017-04-04 NOTE — Progress Notes (Signed)
D: Pt was in bed in his room upon initial approach.  Pt presents with anxious affect and mood.  Describes his day as "okay, I guess."  Pt states "I'm ready to go home, I don't belong here."  Pt denies SI/HI, denies hallucinations, reports pain of 7/10 in his rib cage.  Pt has been visible in milieu interacting with peers and staff appropriately.  Pt attended evening group.    A: Introduced self to pt.  Actively listened to pt and offered support and encouragement. Medication administered per order.  PRN medication administered for pain and anxiety.  Q15 minute safety checks maintained.  R: Pt is safe on the unit.  Pt is compliant with medications.  Pt verbally contracts for safety.  Will continue to monitor and assess.

## 2017-04-05 ENCOUNTER — Encounter (HOSPITAL_COMMUNITY): Payer: Self-pay | Admitting: Behavioral Health

## 2017-04-05 LAB — T3, FREE: T3, Free: 2 pg/mL (ref 2.0–4.4)

## 2017-04-05 MED ORDER — HYDROXYZINE HCL 25 MG PO TABS: 25.0000 mg | ORAL_TABLET | Freq: Four times a day (QID) | ORAL | 0 refills | Status: AC | PRN

## 2017-04-05 MED ORDER — TRAZODONE HCL 150 MG PO TABS: 150.0000 mg | ORAL_TABLET | Freq: Every day | ORAL | 0 refills | Status: AC

## 2017-04-05 MED ORDER — NICOTINE POLACRILEX 2 MG MT GUM: 2.0000 mg | CHEWING_GUM | OROMUCOSAL | 0 refills | Status: AC | PRN

## 2017-04-05 NOTE — [facility] Group Notes (Signed)
[facility] LCSW Group Therapy Note  Date/Time: 04/05/2017 at 1:15pm  Type of Therapy/Topic:  Group Therapy:  Balance in Life  Participation Level: Active  Description of Group:    This group will address the concept of balance and how it feels and looks when one is unbalanced. Patients will be encouraged to process areas in their lives that are out of balance, and identify reasons for remaining unbalanced. Facilitators will guide patients utilizing problem- solving interventions to address and correct the stressor making their life unbalanced. Understanding and applying boundaries will be explored and addressed for obtaining  and maintaining a balanced life. Patients will be encouraged to explore ways to assertively make their unbalanced needs known to significant others in their lives, using other group members and facilitator for support and feedback.  Therapeutic Goals: 1. Patient will identify two or more emotions or situations they have that consume much of in their lives. 2. Patient will identify signs/triggers that life has become out of balance:  3. Patient will identify two ways to set boundaries in order to achieve balance in their lives:  4. Patient will demonstrate ability to communicate their needs through discussion and/or role plays  Summary of Patient Progress: Patient actively participated in group on today. Patient was able to identify areas he felt were out of balance and identify reasons why. Gaynelle AduRobbie stated [place] [facility] [place] [facility] [place] and [place] his new job so that things could get back on track. Patient was also able to identify ways he could set boundaries in order to achieve a more balanced life. Patient interacted positively with his peers and was receptive to feedback provided by CSW.    Therapeutic Modalities:   Cognitive Behavioral Therapy Solution-Focused Therapy Assertiveness Training   Fernande BoydenJoyce Keelen Quevedo, [place] Clinical Social Worker [facility] Ph:  [phone]

## 2017-04-05 NOTE — Plan of Care (Signed)
Problem: Activity: Goal: Sleeping patterns will improve Outcome: Progressing Slept 6 hours last night according to flowsheet.     

## 2017-04-05 NOTE — [facility] Counselor (Signed)
CSW contacted patient's mother, Tiana LoftKathy Moore ([phone]).  Mother reported that father came to visit patient last night and visit went well. Mother reported that they are okay with him returning home today if discharged. Mother stated that she and patient have discussed the misinterpretation of the letter as a suicide note. Mother stated that she could not remember what was in the note but he stated "I'm am sorry for what I did" and "You don't have to worry about it again." Mother stated that they discussed in the future for him to communicate his feelings and continue to get help when he leaves. Mother reported that they could pick up patient today if discharged.   Nira Retortelilah Inika Bellanger, MSW, LCSW Clinical Social Worker

## 2017-04-05 NOTE — Progress Notes (Signed)
  [facility] :  Will you be returning to the same living situation after discharge:  Yes,  patient returning home. At discharge, do you have transportation home?: Yes,  patient has own vehicle. Do you have the ability to pay for your medications: Yes,  patient has insurance.  Release of information consent forms completed and in the chart;  Patient's signature needed at discharge.  Patient to Follow up at: Follow-up Information    [facility] Follow up on 04/09/2017.   Why:  Appt on Monday at 9:00AM for hospital follow-up. Please bring:  and any proof of income/medicaid card if you have it. Thank you.  Contact information: [address] [place] [id number] [phone]           Next level of care provider has access to [facility] Link:no  Safety Planning and Suicide Prevention discussed: Yes,  see Suicide Prevention Education note.   Have you used any form of tobacco in the last 30 days? (Cigarettes, Smokeless Tobacco, Cigars, and/or Pipes): Yes  Has patient been referred to the Quitline?: Patient refused referral  Patient has been referred for addiction treatment: Pt. refused referral  Hessie DibbleDelilah R Kaylia Winborne, LCSW 04/05/2017, 4:01 PM

## 2017-04-05 NOTE — Progress Notes (Signed)
Patient ID: Peter HubertRobbie Rutt, male   DOB: 10/15/1990, 26 y.o.   MRN: [id number]  Discharge Note: Belongings returned to patient at time of discharge. Discharge instructions and medications were reviewed with patient. Patient verbalized understanding of both medications and discharge instructions. Patient discharged to lobby where his ride was waiting. Q15 minute safety checks maintained until time of discharge. No distress noted upon discharge.

## 2017-04-05 NOTE — Progress Notes (Signed)
Patient ID: Peter Wiley, male   DOB: 1991-04-06, 26 y.o.   MRN: [id number]  DAR: Pt. Denies SI/HI and A/V Hallucinations. He reports sleep is fair, appetite is good, and concentration level is good. He rates depression 0/10, hopelessness 0/10, and anxiety 3/10. He received PRN medications this morning for pain and withdrawal symptoms. He reports relief upon reassessment. Support and encouragement provided to the patient. Scheduled clonidine was not administered this morning as patient's BP was low. Patient initially is anxious, fidgety, irritable and intrusive this morning. However, after receiving medications he was in a more pleasant mood. He reports that he feels ready to discharge today. Q15 minute checks are maintained for safety.

## 2017-04-05 NOTE — [facility] Suicide Risk Assessment (Signed)
[facility] Discharge Suicide Risk Assessment   Principal Problem: Major depressive disorder, recurrent severe without psychotic features [facility]) Discharge Diagnoses:  Patient Active Problem List   Diagnosis Date Noted  . Substance induced mood disorder ([place]) [F19.94] 04/03/2017  . Major depressive disorder, recurrent severe without psychotic features ([place]) [F33.2] 04/02/2017  . Opiate dependence, continuous ([place]) [F11.20] 04/02/2017    Total Time spent with patient: 45 minutes  Musculoskeletal: Strength & Muscle Tone: within normal limits Gait & Station: normal Patient leans: N/A  Psychiatric Specialty Exam: Review of Systems  Constitutional: Negative.   HENT: Negative.   Eyes: Negative.   Respiratory: Negative.   Cardiovascular: Negative.   Gastrointestinal: Negative.   Genitourinary: Negative.   Musculoskeletal: Negative.   Skin: Negative.   Neurological: Negative.   Endo/Heme/Allergies: Negative.   Psychiatric/Behavioral: Negative for depression, hallucinations, memory loss and suicidal ideas. The patient is not nervous/anxious and does not have insomnia.     Blood pressure (!) 93/57, pulse 77, temperature 98.6 F (37 C), temperature source Oral, resp. rate 15, height 5' 9"  (1.753 m), weight 59.4 kg (131 lb).Body mass index is 19.35 kg/m.  General Appearance: Neatly dressed, pleasant, engaging well and cooperative. Appropriate behavior. Not in any distress. Good relatedness. Not internally stimulated.   Eye Contact::  Good  Speech:  Spontaneous, normal prosody. Normal tone and rate.   Volume:  Normal  Mood:  Euthymic  Affect:  Appropriate and Full Range  Thought Process:  Linear  Orientation:  Full (Time, Place, and Person)  Thought Content:  Future oriented. No delusional theme. No preoccupation with violent thoughts. No negative ruminations. No obsession.  No hallucination in any modality.   Suicidal Thoughts:  No  Homicidal Thoughts:  No  Memory:  Immediate;    Good Recent;   Good Remote;   Good  Judgement:  Good  Insight:  Good  Psychomotor Activity:  Normal  Concentration:  Good  Recall:  Good  Fund of Knowledge:Good  Language: Good  Akathisia:  NA  Handed:    AIMS (if indicated):     Assets:  Communication Skills Desire for Improvement Housing Intimacy Physical Health Resilience Social Support Talents/Skills Transportation Vocational/Educational  Sleep:  Number of Hours: 6.5  Cognition: WNL  ADL's:  Intact   Clinical  Assessment::   26 yo Caucasian male, single, lives with his family. Background history of SUD. Involuntarily committed by his family on account of suicidal thoughts. Left text messages and notes for his family before he went missing. UDS was positive for THC and amphetamines. Recent suicidal behavior. Attempted to hang himself a couple of months ago.   Social worked has been in contact with his family. His father has been visiting regularly. Family now believes that he was not suicidal as his note was misconstrued. Family now wants him back home. They do not feel that he is a danger to himself. I met with the patient today. He reports that he is not suicidal. Says he actually has job opportunities lined up. Says he was trying to communicate that when they thought he meant he was suicidal. Reports that he is in good spirits. Not feeling depressed. Reports normal energy and interest. Has been maintaining normal biological functions. He is able to think clearly. He is able to focus on task. His thoughts are not crowded or racing. No evidence of mania. No hallucination in any modality. He is not making any delusional statement. No passivity of will/thought. He is fully in touch with reality. No  thoughts of suicide. No thoughts of homicide. No violent thoughts. No overwhelming anxiety.  Nursing staff reports that patient has been appropriate on the unit. Patient has been interacting well with peers. No behavioral issues. Patient  has not voiced any suicidal thoughts. Patient has not been observed to be internally stimulated. Patient has been adherent with treatment recommendations. Patient has been tolerating their medication well.   Patient was discussed at team. Team members feels that patient is back to his baseline level of function. Team agrees with plan to discharge patient today.    Demographic Factors:  Male and Low socioeconomic status  Loss Factors: NA  Historical Factors: Prior suicide attempts and Impulsivity  Risk Reduction Factors:   Sense of responsibility to family, Living with another person, especially a relative, Positive social support, Positive therapeutic relationship and Positive coping skills or problem solving skills  Continued Clinical Symptoms:  As above  Cognitive Features That Contribute To Risk:  None    Suicide Risk:  Minimal: No identifiable suicidal ideation.  Patient is not having any thoughts of suicide at this time. Modifiable risk factors targeted during this admission includes depression and substance use. Demographical and historical risk factors cannot be modified. Patient is now engaging well. Patient is reliable and is future oriented. We have buffered patient's support structures. At this point, patient is at low risk of suicide. Patient is aware of the effects of psychoactive substances on decision making process. Patient has been provided with emergency contacts. Patient acknowledges to use resources provided if unforseen circumstances changes their current risk stratification.    Follow-up Information    [facility] Follow up on 04/09/2017.   Why:  Appt on Monday at 9:00AM for hospital follow-up. Please bring:  and any proof of income/medicaid card if you have it. Thank you.  Contact information: [address] [place] [address] [phone]           Plan Of Care/Follow-up recommendations:  1. Continue current psychotropic medications 2.  Mental health and addiction follow up as arranged.  3. Discharge in care of his family 4. Provided limited quantity of prescriptions   Artist [place], MD 04/05/2017, 1:23 PM

## 2017-04-05 NOTE — Progress Notes (Signed)
Adult Psychoeducational Group Note  Date:  04/05/2017 Time:  10:19 AM  Group Topic/Focus:  Leisure and lifestyle changes   Participation Level:  Active  Participation Quality:  Appropriate  Affect:  Appropriate  Cognitive:  Appropriate  Insight: Good and Improving  Engagement in Group:  Engaged  Modes of Intervention:  Activity and Discussion  Additional Comments:  Pt attended group this morning and participated. Today's topic is leisure and lifestyle changes. Pt shared he enjoys music and likes to listen to hip hop and country. Pt  reports he wants to go back home. Pt denies SI/HI at this time. Pt was pleasant and appropriate in group.   Aeron Donaghey A 04/05/2017, 10:19 AM

## 2017-04-05 NOTE — Discharge Summary (Signed)
Physician Discharge Summary Note  Patient:  Peter Wiley is an 26 y.o., male MRN:  [id number] DOB:  01-22-1991 Patient phone:  [phone] (home)  Patient address:   [address] Peter [place] [address],  Total Time spent with patient: 30 minutes  Date of Admission:  04/02/2017 Date of Discharge: 04/05/2017  Reason for Admission: SI,  Substance abuse   Principal Problem: Major depressive disorder, recurrent severe without psychotic features [facility]) Discharge Diagnoses: Patient Active Problem List   Diagnosis Date Noted  . Substance induced mood disorder (HCC) [F19.94] 04/03/2017  . Major depressive disorder, recurrent severe without psychotic features (HCC) [F33.2] 04/02/2017  . Opiate dependence, continuous (HCC) [F11.20] 04/02/2017    Past Psychiatric History: Hx of substance abuse  Past Medical History:  Past Medical History:  Diagnosis Date  . Acid reflux   . Drug abuse   . Heart aneurysm   . Heart murmur   . Hepatitis   . Migraines   . Pneumothorax     Past Surgical History:  Procedure Laterality Date  . CARDIAC SURGERY    . CHEST TUBE INSERTION    . WRIST SURGERY     Family History: History reviewed. No pertinent family history. Family Psychiatric  History: None noted Social History:  History  Alcohol Use  . Yes    Comment: occ     History  Drug Use  . Types: Methamphetamines, Marijuana, IV    Comment: heroin    Social History   Social History  . Marital status: Single    Spouse name: N/A  . Number of children: N/A  . Years of education: N/A   Social History Main Topics  . Smoking status: Current Every Day Smoker    Packs/day: 1.00    Types: Cigarettes  . Smokeless tobacco: Never Used  . Alcohol use Yes     Comment: occ  . Drug use: Yes    Types: Methamphetamines, Marijuana, IV     Comment: heroin  . Sexual activity: Yes   Other Topics Concern  . None   Social History Narrative  . None    Hospital Course:  26 y.o. Male reports that he  has had several issues over the last few months that has pushed him back to using drugs. He had to have surgery in June due to blood around his lungs and had a chest tube placed. He had been clean from drugs for 10 months, but he relapsed due to his girlfriends parents harassing him because they do not like him and he gives a very lengthy story about them trying to sabotage their relationship. He had gotten some heroin and his girlfriend new, so she left and her mom came to get their children from him. He then went to the bathroom and used heroin while his children were in the living room. The next day he states that he wrote an apology letter for his parents, but it was thought to be a suicide note by them. He reports that he didn't even give it to them, his cousin found it and gave it to them. He denies any SI/HI/AVH and denies any depression or anxiety. He reports that he was prescribed Zoloft 50 mg PO Daily but he doesn't want to take it because he felt worse while on it. He reports that the amount of Heroin he took was a lot less than he use to take and this was not an overdose attempt, the heroin just hit him harder due to being  clean for 10 months. He states that he plans to go to [facility] for follow-up for therapy or counseling after discharge.  Assessment by MD:25 yo Caucasian male, single, lives with his family. Background history of SUD. Involuntarily committed by his family on account of suicidal thoughts. Left text messages and notes for his family before he went missing. UDS was positive for THC and amphetamines.  Patient attempted to hang himself a couple of months ago. He dropped his weight and was cut loose by his father. Patient is minimizing recent suicidal thoughts. Says his note was misconstrued as he wants to move to [place]. No associated psychotic features. No associated depression. Says he has not felt as happy as he does feel now. No past benefit from psychotropic medications. He does not  to be prescribed any psychotropic medications. Focused on getting Suboxone. No thoughts of violence. No homicidal thoughts. Denies any new stressors. Very focused on being discharged.  Discharge Evaluation: After the above admission assessment and during this hospital course, patients presenting symptoms were identified. Patients UD was positive for amphetamines and THC . Detoxification protocol administered as necessary. Patient was treated and discharged with the following medications;vistaril 25 mg po q6hrs as needed for anxiety, Trazodone 150 mg po daily at bedtime for sleep, nicorette gum 2 mg as needed for smoking cessation. He denied side effects to these medications. No psychotropic medications for depression, psychosis or behavioral issues were  prescribed during his hospital course per MD recommendation. Patient was very withdrawn during the begining of this hospital course however became more participative.  AA/NA meetings were offered & held on the unit. Prior to discharge, while on the unit, patient was able to verbalize learned coping skills for better management of depression and suicidal thoughts before his discharge home.   During the course of his hospitalization, improvement was monitored by observation and [name]'s daily  report of symptom reduction, presentation of good affect,and overall improvement in mood & behavior.  Patient tolerated his treatment regimen without any adverse effects reported.  Peter Wiley was evaluated by MD prior to discharge. MD agreed that Peter Wiley was both mentally & medically stable to be discharged to continue mental health care on an outpatient basis as noted below. He was provided with all the necessary information needed to make this appointment without problems.  Upon discharge, Peter Wiley denied any SI/HI, AVH, delusional thoughts, or paranoia. He denied any substance withdrawal symptoms. He was [facility]  [facility] to be  taken to his pharmacy. [facility] with all personal belongings in no apparent distress. Transportation per patients arrangement.  Physical Findings: AIMS: Facial and Oral Movements Muscles of Facial Expression: None, normal Lips and Perioral Area: None, normal Jaw: None, normal Tongue: None, normal,Extremity Movements Upper (arms, wrists, hands, fingers): None, normal Lower (legs, knees, ankles, toes): None, normal, Trunk Movements Neck, shoulders, hips: None, normal, Overall Severity Severity of abnormal movements (highest score from questions above): None, normal Incapacitation due to abnormal movements: None, normal Patient's awareness of abnormal movements (rate only patient's report): No Awareness, Dental Status Current problems with teeth and/or dentures?: No Does patient usually wear dentures?: No  CIWA:    COWS:  COWS Total Score: 4  Musculoskeletal: Strength & Muscle Tone: within normal limits Gait & Station: normal Patient leans: N/A  Psychiatric Specialty Exam: SEE SRA BY MD  Physical Exam  Nursing note and vitals reviewed. Constitutional: He is oriented to person, place, and time.  Neurological: He is alert  and oriented to person, place, and time.    Review of Systems  Psychiatric/Behavioral: Positive for substance abuse (Hx of substance abuse ). Negative for hallucinations, memory loss and suicidal ideas. Depression: improved. Nervous/anxious: improved. Insomnia: improved.   All other systems reviewed and are negative.   Blood pressure (!) 99/57, pulse (!) 101, temperature 98.6 F (37 C), temperature source Oral, resp. rate 15, height 5\' 9"  (1.753 m), weight 131 lb (59.4 kg).Body mass index is 19.35 kg/m.    Have you used any form of tobacco in the last 30 days? (Cigarettes, Smokeless Tobacco, Cigars, and/or Pipes): Yes  Has this patient used any form of tobacco in the last 30 days? (Cigarettes, Smokeless Tobacco, Cigars, and/or Pipes) Yes, A prescription for an  FDA-approved tobacco cessation medication was provided at discharge.    Blood Alcohol level:  Lab Results  Component Value Date   ETH <5 04/01/2017   ETH 48 (H) 01/20/2013    Metabolic Disorder Labs:  Lab Results  Component Value Date   HGBA1C 4.7 (L) 04/03/2017   MPG 88 04/03/2017   No results found for: PROLACTIN Lab Results  Component Value Date   CHOL 147 04/03/2017   TRIG 89 04/03/2017   HDL 40 (L) 04/03/2017   CHOLHDL 3.7 04/03/2017   VLDL 18 04/03/2017   LDLCALC 89 04/03/2017    See Psychiatric Specialty Exam and Suicide Risk Assessment completed by Attending Physician prior to discharge.  Discharge destination:  Home  Is patient on multiple antipsychotic therapies at discharge:  No   Has Patient had three or more failed trials of antipsychotic monotherapy by history:  No  Recommended Plan for Multiple Antipsychotic Therapies: NA   Allergies as of 04/05/2017      Reactions   Doxycycline Other (See Comments)   "blacks out"   Keflex [cephalexin] Nausea And Vomiting   Neosporin [neomycin-bacitracin Zn-polymyx] Other (See Comments)   REACTION: causes more irritation to skin   Penicillins    Has patient had a PCN reaction causing immediate rash, facial/tongue/throat swelling, SOB or lightheadedness with hypotension: No Has patient had a PCN reaction causing severe rash involving mucus membranes or skin necrosis: No Has patient had a PCN reaction that required hospitalization: No Has patient had a PCN reaction occurring within the last 10 years: No If all of the above answers are "NO", then may proceed with Cephalosporin use.   Sulfa Antibiotics Nausea And Vomiting      Medication List    STOP taking these medications   ibuprofen 800 MG tablet Commonly known as:  ADVIL,MOTRIN   meloxicam 15 MG tablet Commonly known as:  MOBIC   methocarbamol 500 MG tablet Commonly known as:  ROBAXIN   methocarbamol 750 MG tablet Commonly known as:  ROBAXIN    sertraline 50 MG tablet Commonly known as:  ZOLOFT   traMADol 50 MG tablet Commonly known as:  ULTRAM     TAKE these medications     Indication  buprenorphine-naloxone 8-2 mg Subl SL tablet Commonly known as:  SUBOXONE Place 0.5 tablets under the tongue daily.  Indication:  Opioid Dependence   hydrOXYzine 25 MG tablet Commonly known as:  ATARAX/VISTARIL Take 1 tablet (25 mg total) by mouth every 6 (six) hours as needed for anxiety.  Indication:  Feeling Anxious   nicotine polacrilex 2 MG gum Commonly known as:  NICORETTE Take 1 each (2 mg total) by mouth as needed for smoking cessation.  Indication:  Nicotine Addiction   tamsulosin 0.4  MG Caps capsule Commonly known as:  FLOMAX Take 0.4 mg by mouth daily.  Indication:  urinary retention symtpoms   traZODone 150 MG tablet Commonly known as:  DESYREL Take 1 tablet (150 mg total) by mouth at bedtime.  Indication:  Trouble Sleeping      Follow-up Information    [facility] Follow up on 04/09/2017.   Why:  Appt on Monday at 9:00AM for hospital follow-up. Please bring:  and any proof of income/medicaid card if you have it. Thank you.  Contact information: [address] [place] [address] [phone]           Follow-up recommendations: Follow up with your outpatient provided for any medical issues. Activity & diet as recommended by your primary care provider.  Comments: Patient is instructed prior to discharge to: Take all medications as prescribed by his/her mental healthcare provider. Report any adverse effects and or reactions from the medicines to his/her outpatient provider promptly. Patient has been instructed & cautioned: To not engage in alcohol and or illegal drug use while on prescription medicines. In the event of worsening symptoms, patient is instructed to call the crisis hotline, 911 and or go to the nearest ED for appropriate evaluation and treatment of symptoms. To follow-up with his/her  primary care provider for your other medical issues, concerns and or health care needs.  Signed: Denzil Magnuson, NP 04/05/2017, 11:50 AM

## 2017-04-05 NOTE — Progress Notes (Signed)
D: Pt was in the dayroom upon initial approach.  Pt presents with anxious affect and mood.  He reports his day has been "straight I guess."  Pt states he is "glad" because the "social worker told me I was going home Friday."  Pt reports he feels safe to discharge Friday.  Pt denies SI/HI, denies hallucinations, reports rib pain of 9/10.  Pt has been visible in milieu interacting with peers and staff appropriately.  Pt attended evening group.    A: Introduced self to pt.  Actively listened to pt and offered support and encouragement. Medication administered per order.  PRN medication administered for pain, muscle spasms, and anxiety.  Q15 minute safety checks maintained.  R: Pt is safe on the unit.  Pt is compliant with medications.  Pt verbally contracts for safety.  Will continue to monitor and assess.

## 2018-11-23 ENCOUNTER — Other Ambulatory Visit: Payer: Self-pay

## 2018-11-23 ENCOUNTER — Emergency Department (HOSPITAL_COMMUNITY)
Admission: EM | Admit: 2018-11-23 | Discharge: 2018-11-23 | Disposition: A | Discharge status: Home / Self Care | Payer: Self-pay | Attending: Emergency Medicine | Admitting: Emergency Medicine

## 2018-11-23 ENCOUNTER — Encounter (HOSPITAL_COMMUNITY): Payer: Self-pay | Admitting: Emergency Medicine

## 2018-11-23 DIAGNOSIS — Z79899 Other long term (current) drug therapy: Secondary | ICD-10-CM | POA: Insufficient documentation

## 2018-11-23 DIAGNOSIS — T50901A Poisoning by unspecified drugs, medicaments and biological substances, accidental (unintentional), initial encounter: Secondary | ICD-10-CM

## 2018-11-23 DIAGNOSIS — T43621A Poisoning by amphetamines, accidental (unintentional), initial encounter: Secondary | ICD-10-CM | POA: Insufficient documentation

## 2018-11-23 DIAGNOSIS — F17210 Nicotine dependence, cigarettes, uncomplicated: Secondary | ICD-10-CM | POA: Insufficient documentation

## 2018-11-23 HISTORY — DX: Other psychoactive substance use, unspecified, uncomplicated: F19.90

## 2018-11-23 LAB — COMPREHENSIVE METABOLIC PANEL
ALT: 222 U/L — AB (ref 0–44)
AST: 93 U/L — AB (ref 15–41)
Albumin: 4.7 g/dL (ref 3.5–5.0)
Alkaline Phosphatase: 61 U/L (ref 38–126)
Anion gap: 11 (ref 5–15)
BUN: 17 mg/dL (ref 6–20)
CHLORIDE: 101 mmol/L (ref 98–111)
CO2: 26 mmol/L (ref 22–32)
Calcium: 9.5 mg/dL (ref 8.9–10.3)
Creatinine, Ser: 1.03 mg/dL (ref 0.61–1.24)
Glucose, Bld: 134 mg/dL — ABNORMAL HIGH (ref 70–99)
Potassium: 3.5 mmol/L (ref 3.5–5.1)
SODIUM: 138 mmol/L (ref 135–145)
Total Bilirubin: 1 mg/dL (ref 0.3–1.2)
Total Protein: 8.9 g/dL — ABNORMAL HIGH (ref 6.5–8.1)

## 2018-11-23 LAB — CBC WITH DIFFERENTIAL/PLATELET
ABS IMMATURE GRANULOCYTES: 0.01 10*3/uL (ref 0.00–0.07)
BASOS PCT: 0 %
Basophils Absolute: 0 10*3/uL (ref 0.0–0.1)
EOS ABS: 0.1 10*3/uL (ref 0.0–0.5)
Eosinophils Relative: 1 %
HCT: 48.7 % (ref 39.0–52.0)
Hemoglobin: 16.1 g/dL (ref 13.0–17.0)
IMMATURE GRANULOCYTES: 0 %
Lymphocytes Relative: 15 %
Lymphs Abs: 1.1 10*3/uL (ref 0.7–4.0)
MCH: 31.9 pg (ref 26.0–34.0)
MCHC: 33.1 g/dL (ref 30.0–36.0)
MCV: 96.4 fL (ref 80.0–100.0)
MONOS PCT: 9 %
Monocytes Absolute: 0.7 10*3/uL (ref 0.1–1.0)
NEUTROS ABS: 5.6 10*3/uL (ref 1.7–7.7)
NEUTROS PCT: 75 %
PLATELETS: 230 10*3/uL (ref 150–400)
RBC: 5.05 MIL/uL (ref 4.22–5.81)
RDW: 12.1 % (ref 11.5–15.5)
WBC: 7.5 10*3/uL (ref 4.0–10.5)
nRBC: 0 % (ref 0.0–0.2)

## 2018-11-23 LAB — ACETAMINOPHEN LEVEL: Acetaminophen (Tylenol), Serum: <10 ug/mL — ABNORMAL LOW (ref 10–30)

## 2018-11-23 LAB — RAPID URINE DRUG SCREEN, HOSP PERFORMED
Amphetamines: POSITIVE — AB
BARBITURATES: NOT DETECTED
BENZODIAZEPINES: NOT DETECTED
COCAINE: NOT DETECTED
OPIATES: NOT DETECTED
TETRAHYDROCANNABINOL: POSITIVE — AB

## 2018-11-23 LAB — ETHANOL: Alcohol, Ethyl (B): <10 mg/dL (ref <10)

## 2018-11-23 LAB — SALICYLATE LEVEL: Salicylate Lvl: <7 mg/dL (ref 2.8–30.0)

## 2018-11-23 MED ORDER — LORAZEPAM 0.5 MG PO TABS
0.5000 mg | ORAL_TABLET | Freq: Once | ORAL | Status: AC
  Administered 2018-11-23: 0.5 mg via ORAL
  Filled 2018-11-23: qty 1

## 2018-11-23 MED ORDER — LORAZEPAM 2 MG/ML IJ SOLN
0.5000 mg | Freq: Once | INTRAMUSCULAR | Status: DC
Start: 2018-11-23 — End: 2018-11-23

## 2018-11-23 NOTE — ED Notes (Signed)
Pt OD on heroin two days ago.

## 2018-11-23 NOTE — ED Notes (Signed)
Pt states he used crystal meth at 1300.

## 2018-11-23 NOTE — ED Triage Notes (Signed)
Pt states he injected crystal meth today, per rescue squad pt was not breathing and was being bagged. 2 mg of Narcan nasally PTA of EMS with no response, EMS gave another 2 mg and pt began breathing. Pt is alert and oriented at this time, c/o N/ and anxiety.

## 2018-11-23 NOTE — ED Provider Notes (Signed)
[facility] EMERGENCY DEPARTMENT Provider Note   CSN: [id number] Arrival date & time: 11/23/18  1550    History   Chief Complaint Chief Complaint  Patient presents with  . Drug Overdose    HPI Peter Wiley is a 28 y.o. male.     HPI Patient states he injected crystal meth today at 1 PM.  Was brought to the rescue squad by friends unresponsive and not breathing.  Patient was given 4 mg of Narcan.  Patient is now alert and oriented.  He is complaining of anxiety.  No known trauma.  Patient states he overdosed on heroin 2 days ago.  Denies any other coingestants.  Denies suicidal ideation. Past Medical History:  Diagnosis Date  . Acid reflux   . Drug abuse (HCC)   . Heart aneurysm   . Heart murmur   . Hepatitis   . IV drug user   . Migraines   . Pneumothorax     Patient Active Problem List   Diagnosis Date Noted  . Substance induced mood disorder (HCC) 04/03/2017  . Major depressive disorder, recurrent severe without psychotic features (HCC) 04/02/2017  . Opiate dependence, continuous (HCC) 04/02/2017    Past Surgical History:  Procedure Laterality Date  . CARDIAC SURGERY    . CHEST TUBE INSERTION    . WRIST SURGERY          Home Medications    Prior to Admission medications   Medication Sig Start Date End Date Taking? Authorizing Provider  buprenorphine-naloxone (SUBOXONE) 8-2 mg SUBL SL tablet Place 0.5 tablets under the tongue daily.    [provider]  hydrOXYzine (ATARAX/VISTARIL) 25 MG tablet Take 1 tablet (25 mg total) by mouth every 6 (six) hours as needed for anxiety. 04/05/17   Denzil Magnuson, NP  nicotine polacrilex (NICORETTE) 2 MG gum Take 1 each (2 mg total) by mouth as needed for smoking cessation. 04/05/17   Denzil Magnuson, NP  tamsulosin (FLOMAX) 0.4 MG CAPS capsule Take 0.4 mg by mouth daily.    [provider]  traZODone (DESYREL) 150 MG tablet Take 1 tablet (150 mg total) by mouth at bedtime. 04/05/17   Denzil Magnuson, NP     Family History History reviewed. No pertinent family history.  Social History Social History   Tobacco Use  . Smoking status: Current Every Day Smoker    Packs/day: 1.00    Types: Cigarettes  . Smokeless tobacco: Never Used  Substance Use Topics  . Alcohol use: Yes    Comment: occ  . Drug use: Yes    Types: Methamphetamines, Marijuana, IV    Comment: heroin     Allergies   Doxycycline; Keflex [cephalexin]; Neosporin [neomycin-bacitracin zn-polymyx]; Penicillins; and Sulfa antibiotics   Review of Systems Review of Systems  Constitutional: Negative for chills and fever.  Eyes: Negative for visual disturbance.  Respiratory: Negative for shortness of breath.   Gastrointestinal: Positive for nausea. Negative for abdominal pain, diarrhea and vomiting.  Musculoskeletal: Negative for back pain and neck pain.  Skin: Positive for wound.  Neurological: Positive for syncope. Negative for weakness, light-headedness, numbness and headaches.  Psychiatric/Behavioral: Negative for suicidal ideas. The patient is nervous/anxious.   All other systems reviewed and are negative.    Physical Exam Updated Vital Signs BP (!) 143/78   Pulse (!) 112   Resp (!) 29   Ht  (1.778 m)   Wt 83.9 kg   SpO2 100%   BMI 26.54 kg/m   Physical Exam Vitals  signs and nursing note reviewed.  Constitutional:      Appearance: Normal appearance. He is well-developed.  HENT:     Head: Normocephalic and atraumatic.     Comments: No obvious scalp trauma.  Midface is stable.  No intraoral trauma.    Mouth/Throat:     Mouth: Mucous membranes are dry.  Eyes:     Pupils: Pupils are equal, round, and reactive to light.  Neck:     Musculoskeletal: Normal range of motion and neck supple. No neck rigidity or muscular tenderness.     Comments: No posterior midline cervical tenderness to palpation. Cardiovascular:     Rate and Rhythm: Normal rate and regular rhythm.     Heart sounds: No murmur. No  friction rub. No gallop.   Pulmonary:     Effort: Pulmonary effort is normal. No respiratory distress.     Breath sounds: Normal breath sounds. No stridor. No wheezing, rhonchi or rales.     Comments: Patient has tenderness over the sternum where he has an abrasion from sternal rub.  No crepitance or deformity. Chest:     Chest wall: Tenderness present.  Abdominal:     General: Bowel sounds are normal.     Palpations: Abdomen is soft.     Tenderness: There is no abdominal tenderness. There is no guarding or rebound.  Musculoskeletal: Normal range of motion.        General: No swelling, tenderness, deformity or signs of injury.     Right lower leg: No edema.     Left lower leg: No edema.  Lymphadenopathy:     Cervical: No cervical adenopathy.  Skin:    General: Skin is warm and dry.     Capillary Refill: Capillary refill takes less than 2 seconds.     Findings: No erythema or rash.  Neurological:     General: No focal deficit present.     Mental Status: He is alert and oriented to person, place, and time.  Psychiatric:        Behavior: Behavior normal.     Comments: Anxious appearing      ED Treatments / Results  Labs (all labs ordered are listed, but only abnormal results are displayed) Labs Reviewed  COMPREHENSIVE METABOLIC PANEL - Abnormal; Notable for the following components:      Result Value   Glucose, Bld 134 (*)    Total Protein 8.9 (*)    AST 93 (*)    ALT 222 (*)    All other components within normal limits  RAPID URINE DRUG SCREEN, HOSP PERFORMED - Abnormal; Notable for the following components:   Amphetamines POSITIVE (*)    Tetrahydrocannabinol POSITIVE (*)    All other components within normal limits  ACETAMINOPHEN LEVEL - Abnormal; Notable for the following components:   Acetaminophen (Tylenol), Serum <10 (*)    All other components within normal limits  CBC WITH DIFFERENTIAL/PLATELET  ETHANOL  SALICYLATE LEVEL    EKG EKG Interpretation   Date/Time:  Saturday November 23 2018 16:00:06 EDT Ventricular Rate:  107 PR Interval:    QRS Duration: 78 QT Interval:  366 QTC Calculation: 489 R Axis:   60 Text Interpretation:  Sinus tachycardia Prolonged QT interval Confirmed by Loren Racer ([id number]) on 11/23/2018 4:07:16 PM   Radiology No results found.  Procedures Procedures (including critical care time)  Medications Ordered in ED Medications  LORazepam (ATIVAN) tablet 0.5 mg (0.5 mg Oral Given 11/23/18 1721)     Initial Impression /  Assessment and Plan / ED Course  I have reviewed the triage vital signs and the nursing notes.  Pertinent labs & imaging results that were available during my care of the patient were reviewed by me and considered in my medical decision making (see chart for details).       Patient observed 2 and half hours in the emergency department.  Vital signs have normalized.  Mild QT prolongation on EKG.  Advised to avoid illegal drugs.  Return precautions given.   Final Clinical Impressions(s) / ED Diagnoses   Final diagnoses:  Accidental overdose, initial encounter    ED Discharge Orders    None       Loren Racer, MD 11/23/18 [phone]

## 2021-09-15 ENCOUNTER — Encounter (HOSPITAL_COMMUNITY): Payer: Self-pay

## 2021-09-15 ENCOUNTER — Emergency Department (HOSPITAL_COMMUNITY)
Admission: EM | Admit: 2021-09-15 | Discharge: 2021-09-15 | Disposition: A | Discharge status: Home / Self Care | Payer: 59 | Attending: Emergency Medicine | Admitting: Emergency Medicine

## 2021-09-15 DIAGNOSIS — F17210 Nicotine dependence, cigarettes, uncomplicated: Secondary | ICD-10-CM | POA: Diagnosis not present

## 2021-09-15 DIAGNOSIS — T401X1A Poisoning by heroin, accidental (unintentional), initial encounter: Secondary | ICD-10-CM | POA: Diagnosis present

## 2021-09-15 NOTE — ED Triage Notes (Signed)
Pt comes via [facility] EMS after overdose on heroin with fentanyl. Bystanders on scene covered him in ice, pt was unresponsive upon EMS arrival given 2mg  narcan internasal and now Axo x 4

## 2021-09-15 NOTE — ED Provider Notes (Signed)
[facility]-EMERGENCY DEPT [facility] Emergency Department Provider Note MRN:  [id number]  Arrival date & time: 09/15/21     Chief Complaint   Drug Overdose   History of Present Illness   Peter Wiley is a 31 y.o. year-old male with a history of IV drug use presenting to the ED with chief complaint of drug overdose.  Unresponsive on scene, given Narcan in the field by EMS.  Patient has no complaints at time other than "hurt pride".  Has been using heroin for the past 2 years, was on a program for a while but relapsed.  Review of Systems  A thorough review of systems was obtained and all systems are negative except as noted in the HPI and PMH.   Patient's Health History    Past Medical History:  Diagnosis Date   Acid reflux    Drug abuse (HCC)    Heart aneurysm    Heart murmur    Hepatitis    IV drug user    Migraines    Pneumothorax     Past Surgical History:  Procedure Laterality Date   CARDIAC SURGERY     CHEST TUBE INSERTION     WRIST SURGERY      No family history on file.  Social History   Socioeconomic History   Marital status: Single    Spouse name: Not on file   Number of children: Not on file   Years of education: Not on file   Highest education level: Not on file  Occupational History   Not on file  Tobacco Use   Smoking status: Every Day    Packs/day: 1.00    Types: Cigarettes   Smokeless tobacco: Never  Vaping Use   Vaping Use: Never used  Substance and Sexual Activity   Alcohol use: Yes    Comment: occ   Drug use: Yes    Types: Methamphetamines, Marijuana, IV    Comment: heroin   Sexual activity: Yes  Other Topics Concern   Not on file  Social History Narrative   Not on file   Social Determinants of Health   Financial Resource Strain: Not on file  Food Insecurity: Not on file  Transportation Needs: Not on file  Physical Activity: Not on file  Stress: Not on file  Social Connections: Not on file  Intimate Partner Violence: Not on  file     Physical Exam   Vitals:   09/15/21 0230 09/15/21 0300  BP: 113/74 114/70  Pulse:    Resp: 16 17  Temp:    SpO2:      CONSTITUTIONAL: Chronically ill-appearing, NAD NEURO/PSYCH:  Alert and oriented x 3, no focal deficits EYES:  eyes equal and reactive ENT/NECK:  no LAD, no JVD CARDIO: Regular rate, well-perfused, normal S1 and S2 PULM:  CTAB no wheezing or rhonchi GI/GU:  non-distended, non-tender MSK/SPINE:  No gross deformities, no edema SKIN:  no rash, atraumatic   *Additional and/or pertinent findings included in MDM below  Diagnostic and Interventional Summary    EKG Interpretation  Date/Time:  Thursday September 15 2021 00:18:10 EST Ventricular Rate:  103 PR Interval:  140 QRS Duration: 87 QT Interval:  333 QTC Calculation: 436 R Axis:   31 Text Interpretation: Sinus tachycardia Baseline wander in lead(s) V5 Confirmed by Kennis Carina [phone]) on 09/15/2021 1:35:51 AM       Labs Reviewed - No data to display  No orders to display    Medications - No data to display  Procedures  /  Critical Care Procedures  ED Course and Medical Decision Making  Initial Impression and Ddx History consistent with opioid toxidrome with profound sedation requiring Narcan.  He is alert and oriented at this time, nontraumatic exam, normal vital signs.  Will monitor for any signs of any sedation here in the emergency department for a few hours.  Past medical/surgical history that increases complexity of ED encounter: IV drug use  Interpretation of Diagnostics I personally reviewed the EKG and my interpretation is as follows: Sinus rhythm no significant change from prior sinus tachycardia no concerning features      Patient Reassessment and Ultimate Disposition/Management Upon reassessment after 4 hours of observation patient easily wakes, seems to be out of the window for risk of recent patient, appropriate for discharge.  Patient management required discussion with  the following services or consulting groups:  None  Complexity of Problems Addressed Chronic illness with severe exacerbation  Additional Data Reviewed and Analyzed Further history obtained from: Recent discharge summary  Patient Encounter Risk Assessment High  Elmer Sow. Pilar Plate, MD [facility] Emergency Medicine [facility] [email or web address]  Final Clinical Impressions(s) / ED Diagnoses     ICD-10-CM   1. Accidental overdose of heroin, initial encounter [facility])  T40.1X1A       ED Discharge Orders     None        Discharge Instructions Discussed with and Provided to Patient:     Discharge Instructions      You were evaluated in the Emergency Department and after careful evaluation, we did not find any emergent condition requiring admission or further testing in the hospital.  Your exam/testing today was overall reassuring.  Please return to the Emergency Department if you experience any worsening of your condition.  Thank you for allowing [place] to be a part of your care.        Sabas Sous, MD 09/15/21 [phone]

## 2021-09-15 NOTE — Discharge Instructions (Signed)
You were evaluated in the Emergency Department and after careful evaluation, we did not find any emergent condition requiring admission or further testing in the hospital.  Your exam/testing today was overall reassuring.  Please return to the Emergency Department if you experience any worsening of your condition.  Thank you for allowing us to be a part of your care.  

## 2021-09-15 NOTE — ED Notes (Signed)
Pt on 3L O2 °

## 2022-05-07 DIAGNOSIS — K047 Periapical abscess without sinus: Secondary | ICD-10-CM | POA: Diagnosis not present
